# Patient Record
Sex: Female | Born: 1955
Health system: Southern US, Community
[De-identification: ages and names within clinical notes are randomized; demographics above are authoritative.]

## PROBLEM LIST (undated history)

## (undated) DIAGNOSIS — T4145XA Adverse effect of unspecified anesthetic, initial encounter: Secondary | ICD-10-CM

## (undated) DIAGNOSIS — E039 Hypothyroidism, unspecified: Secondary | ICD-10-CM

## (undated) DIAGNOSIS — T8859XA Other complications of anesthesia, initial encounter: Secondary | ICD-10-CM

## (undated) DIAGNOSIS — I10 Essential (primary) hypertension: Secondary | ICD-10-CM

## (undated) HISTORY — PX: TUBAL LIGATION: SHX77

---

## 1999-10-09 ENCOUNTER — Encounter: Payer: Self-pay | Admitting: Gynecology

## 1999-10-09 ENCOUNTER — Ambulatory Visit (HOSPITAL_COMMUNITY): Admission: RE | Admit: 1999-10-09 | Discharge: 1999-10-09 | Payer: Self-pay | Admitting: Gynecology

## 2001-01-26 ENCOUNTER — Other Ambulatory Visit: Admission: RE | Admit: 2001-01-26 | Discharge: 2001-01-26 | Payer: Self-pay | Admitting: Gynecology

## 2001-03-23 ENCOUNTER — Encounter: Payer: Self-pay | Admitting: Gynecology

## 2001-03-23 ENCOUNTER — Encounter: Admission: RE | Admit: 2001-03-23 | Discharge: 2001-03-23 | Payer: Self-pay | Admitting: Gynecology

## 2002-02-16 ENCOUNTER — Other Ambulatory Visit: Admission: RE | Admit: 2002-02-16 | Discharge: 2002-02-16 | Payer: Self-pay | Admitting: Gynecology

## 2003-05-03 ENCOUNTER — Other Ambulatory Visit: Admission: RE | Admit: 2003-05-03 | Discharge: 2003-05-03 | Payer: Self-pay | Admitting: Gynecology

## 2004-05-09 ENCOUNTER — Other Ambulatory Visit: Admission: RE | Admit: 2004-05-09 | Discharge: 2004-05-09 | Payer: Self-pay | Admitting: Gynecology

## 2004-05-24 ENCOUNTER — Encounter: Admission: RE | Admit: 2004-05-24 | Discharge: 2004-05-24 | Payer: Self-pay | Admitting: Gynecology

## 2005-05-14 ENCOUNTER — Other Ambulatory Visit: Admission: RE | Admit: 2005-05-14 | Discharge: 2005-05-14 | Payer: Self-pay | Admitting: Gynecology

## 2005-12-20 ENCOUNTER — Encounter: Admission: RE | Admit: 2005-12-20 | Discharge: 2005-12-20 | Payer: Self-pay | Admitting: Gynecology

## 2006-05-22 ENCOUNTER — Other Ambulatory Visit: Admission: RE | Admit: 2006-05-22 | Discharge: 2006-05-22 | Payer: Self-pay | Admitting: Gynecology

## 2007-01-01 ENCOUNTER — Ambulatory Visit (HOSPITAL_COMMUNITY): Admission: RE | Admit: 2007-01-01 | Discharge: 2007-01-01 | Payer: Self-pay | Admitting: Gynecology

## 2007-05-25 ENCOUNTER — Other Ambulatory Visit: Admission: RE | Admit: 2007-05-25 | Discharge: 2007-05-25 | Payer: Self-pay | Admitting: Gynecology

## 2008-03-15 ENCOUNTER — Ambulatory Visit (HOSPITAL_COMMUNITY): Admission: RE | Admit: 2008-03-15 | Discharge: 2008-03-15 | Payer: Self-pay | Admitting: Gynecology

## 2010-02-21 ENCOUNTER — Ambulatory Visit (HOSPITAL_COMMUNITY): Admission: RE | Admit: 2010-02-21 | Discharge: 2010-02-21 | Payer: Self-pay | Admitting: Gynecology

## 2011-11-04 ENCOUNTER — Encounter: Payer: Self-pay | Admitting: Family Medicine

## 2011-11-04 ENCOUNTER — Ambulatory Visit (INDEPENDENT_AMBULATORY_CARE_PROVIDER_SITE_OTHER): Payer: 59 | Admitting: Family Medicine

## 2011-11-04 DIAGNOSIS — I10 Essential (primary) hypertension: Secondary | ICD-10-CM | POA: Insufficient documentation

## 2011-11-04 NOTE — Progress Notes (Signed)
  Subjective:    Patient ID: Sara Ward, female    DOB: 1956/06/24, 55 y.o.   MRN: [id number]  HPI Sara Ward is a new patient who presents without any current complaints. She has been recieving primary care from Sara Ward, an OB-GYN for several years. However, she is ready to have a primary care physician manager her blood pressure, hypothyroidism, and health maintenance issues. A specific concern of hers is her risk of atherosclerotic disease. Her older sister has heart disease and her younger brother had a heart attack at 33 years old. She has been told that her cholesterol is "normal" in the past.    Review of Systems Patient denies headache, chest pain, shortness of breath, claudication, dizziness, weakness    Objective:   Physical Exam  Constitutional: Vital signs are normal. She appears well-developed and well-nourished. No distress.  Neck: Carotid bruit is not present. No mass and no thyromegaly present.  Cardiovascular: Normal rate, regular rhythm and normal heart sounds.   Pulses:      Dorsalis pedis pulses are 2+ on the right side, and 2+ on the left side.  Pulmonary/Chest: Effort normal and breath sounds normal.  Abdominal: She exhibits no abdominal bruit.    BP 125/82  Pulse 73  Ht 5' (1.524 m)  Wt 169 lb (76.658 kg)  BMI 33.01 kg/m2     Assessment & Plan:  Sara Ward is a very pleasant, well appearing 55 year old female presenting for care.  1. Hypertension - This was diagnosed by another physician. I have no reason to change her medication at this point. Therefore, continue Benicar HCT. Consider changing in the future for medical and financial reasons.   2. Hypothyroidism - Sara Ward is currently taking 50 mcg of levothyroxine. However, she is not certain of the indication. Therefore, I will leave her on the medication and test her TSH.  3. Lab and Follow - Up: Sara Ward will return in 3 weeks for lab draws for fasting lipid panel, CMP, CBC, and TSH. Also, I will  try to obtain records from Sara Ward.   4. Health Maintenance - The patient will agree to a colonoscopy in 2013, after the Christmas holiday.

## 2011-11-04 NOTE — Patient Instructions (Signed)
Dear Mrs. Bouchie,   Thank you for coming to clinic today. It was a pleasure to meet you. Please return in 3 weeks for a lab visit and then one week later to discuss the results.   Take Care,   Dr. Clinton Sawyer

## 2011-11-25 ENCOUNTER — Other Ambulatory Visit: Payer: 59

## 2011-11-25 DIAGNOSIS — I10 Essential (primary) hypertension: Secondary | ICD-10-CM

## 2011-11-25 LAB — COMPREHENSIVE METABOLIC PANEL
ALT: 58 U/L — ABNORMAL HIGH (ref 0–35)
Albumin: 4.7 g/dL (ref 3.5–5.2)
Alkaline Phosphatase: 136 U/L — ABNORMAL HIGH (ref 39–117)
CO2: 29 mEq/L (ref 19–32)
Glucose, Bld: 98 mg/dL (ref 70–99)
Potassium: 4 mEq/L (ref 3.5–5.3)
Sodium: 140 mEq/L (ref 135–145)
Total Protein: 7.1 g/dL (ref 6.0–8.3)

## 2011-11-25 LAB — CBC
HCT: 42.8 % (ref 36.0–46.0)
Hemoglobin: 13.7 g/dL (ref 12.0–15.0)
MCHC: 32 g/dL (ref 30.0–36.0)
RBC: 4.62 MIL/uL (ref 3.87–5.11)

## 2011-11-25 LAB — LIPID PANEL
Cholesterol: 187 mg/dL (ref 0–200)
LDL Cholesterol: 110 mg/dL — ABNORMAL HIGH (ref 0–99)
Triglycerides: 91 mg/dL (ref <150)
VLDL: 18 mg/dL (ref 0–40)

## 2011-11-25 LAB — TSH: TSH: 2.789 u[IU]/mL (ref 0.350–4.500)

## 2011-11-25 NOTE — Progress Notes (Signed)
CMP,FLP,CBC AND TSH DONE TODAY Sara Ward 

## 2011-12-04 ENCOUNTER — Ambulatory Visit (INDEPENDENT_AMBULATORY_CARE_PROVIDER_SITE_OTHER): Payer: 59 | Admitting: Family Medicine

## 2011-12-04 ENCOUNTER — Encounter: Payer: Self-pay | Admitting: Family Medicine

## 2011-12-04 DIAGNOSIS — I10 Essential (primary) hypertension: Secondary | ICD-10-CM

## 2011-12-04 DIAGNOSIS — E039 Hypothyroidism, unspecified: Secondary | ICD-10-CM

## 2011-12-04 DIAGNOSIS — M79674 Pain in right toe(s): Secondary | ICD-10-CM

## 2011-12-04 DIAGNOSIS — M79609 Pain in unspecified limb: Secondary | ICD-10-CM

## 2011-12-04 NOTE — Assessment & Plan Note (Signed)
Patient history and presentation the pain in The MCP joint of the right great toe Is most likely due to damage the joint but does not represent gout or infectious process. Given that This is chronic and not worsening, The patient has deferred any imaging or intervention at this time. I am in agreement with this plan we will reassess the injury at her followup visit.

## 2011-12-04 NOTE — Assessment & Plan Note (Signed)
Her blood pressure is well-controlled on Benicar. The drug is showing no negative side effects and is not cost prohibitive at this time. Therefore we will continue Benicar for hypertension. Recheck labs in 6 months.

## 2011-12-04 NOTE — Assessment & Plan Note (Signed)
Recent TSH Was within normal limits therefore continue Synthroid at 50 mcg daily.

## 2011-12-04 NOTE — Progress Notes (Signed)
  Subjective:    Patient ID: Sara Ward, female    DOB: 04-10-1956, 55 y.o.   MRN: [id number]  HPI Sara Ward is a 55 year old female presenting to clinic today for follow-up for lab work As well as toe pain.  1. Toe pain: The patient reports persistent pain in her right great toe. It initially started approximately one year ago she stubbed her toe. She did not have any images of her foot taken at the time of her injury. Since that time it has hurt consistently in the base of her toe and she feels a popping sensation. The pain is exacerbated by wearing shoes with heels the pain is alleviated by resting her feet. She does not feel it is getting acutely worse. She denies it is swollen, discolored or disfigured. Sara Ward has her previous doctor was concerned that it was gout. However he never treated her for gout.  2. Lab work: Prior to this visit Sara Ward had screening lab work performed that is listed below.  Review of Systems Negative for chest pain, short of breath, fever, chills, and weight loss.     Objective:   Physical Exam Gen.: Alert, oriented, no distress, pleasant and talkative Feet: Right foot with mild tenderness to palpation at the first MCP joint, no edema or erythema present, no bony deformities. Flexion extension of the right great toe or equal to that of the left. Dorsalis pedis pulses intact bilaterally.  CMP     Component Value Date/Time   NA 140 11/25/2011 0849   K 4.0 11/25/2011 0849   CL 101 11/25/2011 0849   CO2 29 11/25/2011 0849   GLUCOSE 98 11/25/2011 0849   BUN 19 11/25/2011 0849   CREATININE 0.85 11/25/2011 0849   CALCIUM 9.8 11/25/2011 0849   PROT 7.1 11/25/2011 0849   ALBUMIN 4.7 11/25/2011 0849   AST 35 11/25/2011 0849   ALT 58* 11/25/2011 0849   ALKPHOS 136* 11/25/2011 0849   BILITOT 0.4 11/25/2011 0849   Lab Results  Component Value Date   WBC 6.6 11/25/2011   HGB 13.7 11/25/2011   HCT 42.8 11/25/2011   MCV 92.6 11/25/2011   PLT 308 11/25/2011     Lab  Results  Component Value Date   TSH 2.789 11/25/2011    Lipid Panel     Component Value Date/Time   CHOL 187 11/25/2011 0849   TRIG 91 11/25/2011 0849   HDL 59 11/25/2011 0849   CHOLHDL 3.2 11/25/2011 0849   VLDL 18 11/25/2011 0849   LDLCALC 110* 11/25/2011 0849          Assessment & Plan:  Sara Ward is a healthy 55 year old female who is here for presentation of right toe pain and to review her labs. Based upon her risk factors she does not need statin treatment for her LDL.

## 2011-12-04 NOTE — Patient Instructions (Signed)
Dear Sara Ward, Thank you for coming to see me in clinic today. Please read below regarding specific issues that we addressed. 1. Cholesterol- your cholesterol is within the appropriate range given your risk factors. There is no need to start cholesterol medication at this point. 2. Colon cancer screening- After the age of 88 as appropriate probably to be screened for colon cancer. Please call the number given to arrange for colonoscopy.  Please follow up in 6 months for lab work and blood pressure check. However if you need anything earlier please call.  Sincerely, Dr. Clinton Sawyer

## 2012-07-30 ENCOUNTER — Encounter: Payer: Self-pay | Admitting: Internal Medicine

## 2012-08-28 ENCOUNTER — Encounter: Payer: 59 | Admitting: Internal Medicine

## 2012-09-10 ENCOUNTER — Other Ambulatory Visit: Payer: Self-pay | Admitting: Gynecology

## 2012-09-10 DIAGNOSIS — Z1231 Encounter for screening mammogram for malignant neoplasm of breast: Secondary | ICD-10-CM

## 2012-09-16 ENCOUNTER — Ambulatory Visit
Admission: RE | Admit: 2012-09-16 | Discharge: 2012-09-16 | Disposition: A | Discharge status: Home / Self Care | Payer: 59 | Source: Ambulatory Visit | Attending: Gynecology | Admitting: Gynecology

## 2012-09-16 DIAGNOSIS — Z1231 Encounter for screening mammogram for malignant neoplasm of breast: Secondary | ICD-10-CM

## 2012-12-23 HISTORY — PX: APPENDECTOMY: SHX54

## 2013-08-13 LAB — HEMOGLOBIN A1C: Hgb A1c MFr Bld: 6.2 % — AB (ref 4.0–6.0)

## 2013-08-13 LAB — TSH: TSH: 2.69 u[IU]/mL (ref <=5.90)

## 2013-08-19 ENCOUNTER — Encounter: Payer: Self-pay | Admitting: Family Medicine

## 2013-11-30 ENCOUNTER — Ambulatory Visit: Admit: 2013-11-30 | Payer: Self-pay | Admitting: General Surgery

## 2013-11-30 ENCOUNTER — Observation Stay (HOSPITAL_COMMUNITY): Payer: 59 | Admitting: Anesthesiology

## 2013-11-30 ENCOUNTER — Encounter (HOSPITAL_COMMUNITY): Admission: EM | Disposition: A | Discharge status: Home / Self Care | Payer: Self-pay | Source: Home / Self Care

## 2013-11-30 ENCOUNTER — Inpatient Hospital Stay (HOSPITAL_COMMUNITY)
Admission: EM | Admit: 2013-11-30 | Discharge: 2013-12-04 | DRG: 340 | Disposition: A | Discharge status: Home / Self Care | Payer: 59 | Attending: Surgery | Admitting: Surgery

## 2013-11-30 ENCOUNTER — Encounter (HOSPITAL_COMMUNITY): Payer: 59 | Admitting: Anesthesiology

## 2013-11-30 ENCOUNTER — Encounter (HOSPITAL_COMMUNITY): Payer: Self-pay | Admitting: Emergency Medicine

## 2013-11-30 DIAGNOSIS — I10 Essential (primary) hypertension: Secondary | ICD-10-CM | POA: Diagnosis present

## 2013-11-30 DIAGNOSIS — K3532 Acute appendicitis with perforation and localized peritonitis, without abscess: Secondary | ICD-10-CM

## 2013-11-30 DIAGNOSIS — K35209 Acute appendicitis with generalized peritonitis, without abscess, unspecified as to perforation: Principal | ICD-10-CM | POA: Diagnosis present

## 2013-11-30 DIAGNOSIS — Z79899 Other long term (current) drug therapy: Secondary | ICD-10-CM

## 2013-11-30 DIAGNOSIS — K3580 Unspecified acute appendicitis: Secondary | ICD-10-CM

## 2013-11-30 DIAGNOSIS — E039 Hypothyroidism, unspecified: Secondary | ICD-10-CM | POA: Diagnosis present

## 2013-11-30 DIAGNOSIS — K352 Acute appendicitis with generalized peritonitis, without abscess: Principal | ICD-10-CM | POA: Diagnosis present

## 2013-11-30 DIAGNOSIS — K3533 Acute appendicitis with perforation and localized peritonitis, with abscess: Secondary | ICD-10-CM | POA: Diagnosis present

## 2013-11-30 DIAGNOSIS — R000 Tachycardia, unspecified: Secondary | ICD-10-CM | POA: Diagnosis not present

## 2013-11-30 HISTORY — DX: Essential (primary) hypertension: I10

## 2013-11-30 HISTORY — DX: Adverse effect of unspecified anesthetic, initial encounter: T41.45XA

## 2013-11-30 HISTORY — PX: LAPAROSCOPIC APPENDECTOMY: SHX408

## 2013-11-30 HISTORY — DX: Other complications of anesthesia, initial encounter: T88.59XA

## 2013-11-30 HISTORY — DX: Hypothyroidism, unspecified: E03.9

## 2013-11-30 LAB — CBC WITH DIFFERENTIAL/PLATELET
Basophils Absolute: 0 10*3/uL (ref 0.0–0.1)
Basophils Relative: 0 % (ref 0–1)
Eosinophils Absolute: 0 10*3/uL (ref 0.0–0.7)
Eosinophils Relative: 0 % (ref 0–5)
HCT: 38.9 % (ref 36.0–46.0)
Hemoglobin: 13 g/dL (ref 12.0–15.0)
Lymphocytes Relative: 9 % — ABNORMAL LOW (ref 12–46)
Lymphs Abs: 1.4 10*3/uL (ref 0.7–4.0)
MCH: 29.5 pg (ref 26.0–34.0)
MCHC: 33.4 g/dL (ref 30.0–36.0)
MCV: 88.4 fL (ref 78.0–100.0)
Monocytes Absolute: 0.9 10*3/uL (ref 0.1–1.0)
Monocytes Relative: 6 % (ref 3–12)
Neutro Abs: 12.7 10*3/uL — ABNORMAL HIGH (ref 1.7–7.7)
Neutrophils Relative %: 85 % — ABNORMAL HIGH (ref 43–77)
Platelets: 245 10*3/uL (ref 150–400)
RBC: 4.4 MIL/uL (ref 3.87–5.11)
RDW: 12.7 % (ref 11.5–15.5)
WBC: 15 10*3/uL — ABNORMAL HIGH (ref 4.0–10.5)

## 2013-11-30 LAB — BASIC METABOLIC PANEL
BUN: 11 mg/dL (ref 6–23)
CO2: 25 mEq/L (ref 19–32)
Calcium: 9.6 mg/dL (ref 8.4–10.5)
Chloride: 91 mEq/L — ABNORMAL LOW (ref 96–112)
Creatinine, Ser: 0.69 mg/dL (ref 0.50–1.10)
GFR calc Af Amer: >90 mL/min (ref >90)
GFR calc non Af Amer: >90 mL/min (ref >90)
Glucose, Bld: 130 mg/dL — ABNORMAL HIGH (ref 70–99)
Potassium: 2.9 mEq/L — ABNORMAL LOW (ref 3.5–5.1)
Sodium: 130 mEq/L — ABNORMAL LOW (ref 135–145)

## 2013-11-30 LAB — URINE MICROSCOPIC-ADD ON

## 2013-11-30 LAB — URINALYSIS, ROUTINE W REFLEX MICROSCOPIC
Bilirubin Urine: NEGATIVE
Glucose, UA: NEGATIVE mg/dL
Ketones, ur: NEGATIVE mg/dL
Leukocytes, UA: NEGATIVE
Nitrite: NEGATIVE
Protein, ur: NEGATIVE mg/dL
Specific Gravity, Urine: 1.037 — ABNORMAL HIGH (ref 1.005–1.030)
Urobilinogen, UA: 0.2 mg/dL (ref 0.0–1.0)
pH: 7 (ref 5.0–8.0)

## 2013-11-30 SURGERY — APPENDECTOMY, LAPAROSCOPIC
Anesthesia: General | Site: Abdomen

## 2013-11-30 MED ORDER — SODIUM CHLORIDE 0.9 % IJ SOLN
INTRAMUSCULAR | Status: AC
  Filled 2013-11-30: qty 10

## 2013-11-30 MED ORDER — MORPHINE SULFATE 4 MG/ML IJ SOLN
6.0000 mg | Freq: Once | INTRAMUSCULAR | Status: AC
  Administered 2013-11-30: 6 mg via INTRAVENOUS
  Filled 2013-11-30: qty 2

## 2013-11-30 MED ORDER — DIPHENHYDRAMINE HCL 12.5 MG/5ML PO ELIX: 12.5000 mg | ORAL_SOLUTION | Freq: Four times a day (QID) | ORAL | Status: DC | PRN

## 2013-11-30 MED ORDER — MEPERIDINE HCL 50 MG/ML IJ SOLN: 6.2500 mg | INTRAMUSCULAR | Status: DC | PRN

## 2013-11-30 MED ORDER — PIPERACILLIN-TAZOBACTAM 3.375 G IVPB
3.3750 g | Freq: Three times a day (TID) | INTRAVENOUS | Status: DC
  Administered 2013-12-01 – 2013-12-04 (×10): 3.375 g via INTRAVENOUS
  Filled 2013-11-30 (×13): qty 50

## 2013-11-30 MED ORDER — ROCURONIUM BROMIDE 100 MG/10ML IV SOLN
INTRAVENOUS | Status: DC | PRN
  Administered 2013-11-30: 30 mg via INTRAVENOUS
  Administered 2013-11-30: 5 mg via INTRAVENOUS

## 2013-11-30 MED ORDER — PROPOFOL 10 MG/ML IV BOLUS
INTRAVENOUS | Status: AC
  Filled 2013-11-30: qty 20

## 2013-11-30 MED ORDER — SUCCINYLCHOLINE CHLORIDE 20 MG/ML IJ SOLN
INTRAMUSCULAR | Status: AC
  Filled 2013-11-30: qty 1

## 2013-11-30 MED ORDER — HEPARIN SODIUM (PORCINE) 5000 UNIT/ML IJ SOLN
5000.0000 [IU] | Freq: Three times a day (TID) | INTRAMUSCULAR | Status: DC
  Administered 2013-12-01 – 2013-12-04 (×10): 5000 [IU] via SUBCUTANEOUS
  Filled 2013-11-30 (×13): qty 1

## 2013-11-30 MED ORDER — PROPOFOL 10 MG/ML IV BOLUS
INTRAVENOUS | Status: DC | PRN
  Administered 2013-11-30: 160 mg via INTRAVENOUS

## 2013-11-30 MED ORDER — PIPERACILLIN-TAZOBACTAM 3.375 G IVPB 30 MIN
3.3750 g | INTRAVENOUS | Status: AC
  Administered 2013-11-30: 3.375 g via INTRAVENOUS
  Filled 2013-11-30 (×2): qty 50

## 2013-11-30 MED ORDER — OXYCODONE HCL 5 MG PO TABS: 5.0000 mg | ORAL_TABLET | Freq: Once | ORAL | Status: DC | PRN

## 2013-11-30 MED ORDER — DIPHENHYDRAMINE HCL 50 MG/ML IJ SOLN: 12.5000 mg | Freq: Four times a day (QID) | INTRAMUSCULAR | Status: DC | PRN

## 2013-11-30 MED ORDER — MIDAZOLAM HCL 5 MG/5ML IJ SOLN
INTRAMUSCULAR | Status: DC | PRN
  Administered 2013-11-30: 1 mg via INTRAVENOUS

## 2013-11-30 MED ORDER — PROMETHAZINE HCL 25 MG/ML IJ SOLN: 6.2500 mg | INTRAMUSCULAR | Status: DC | PRN

## 2013-11-30 MED ORDER — LIDOCAINE HCL (CARDIAC) 20 MG/ML IV SOLN
INTRAVENOUS | Status: DC | PRN
  Administered 2013-11-30: 80 mg via INTRAVENOUS

## 2013-11-30 MED ORDER — FENTANYL CITRATE 0.05 MG/ML IJ SOLN
INTRAMUSCULAR | Status: DC | PRN
  Administered 2013-11-30 (×5): 50 ug via INTRAVENOUS

## 2013-11-30 MED ORDER — FENTANYL CITRATE 0.05 MG/ML IJ SOLN
INTRAMUSCULAR | Status: AC
  Filled 2013-11-30: qty 5

## 2013-11-30 MED ORDER — EPHEDRINE SULFATE 50 MG/ML IJ SOLN
INTRAMUSCULAR | Status: AC
  Filled 2013-11-30: qty 1

## 2013-11-30 MED ORDER — NEOSTIGMINE METHYLSULFATE 1 MG/ML IJ SOLN
INTRAMUSCULAR | Status: DC | PRN
  Administered 2013-11-30: 4 mg via INTRAVENOUS

## 2013-11-30 MED ORDER — PIPERACILLIN-TAZOBACTAM 3.375 G IVPB: 3.3750 g | Freq: Once | INTRAVENOUS | Status: DC

## 2013-11-30 MED ORDER — EPHEDRINE SULFATE 50 MG/ML IJ SOLN
INTRAMUSCULAR | Status: DC | PRN
  Administered 2013-11-30: 5 mg via INTRAVENOUS

## 2013-11-30 MED ORDER — MIDAZOLAM HCL 2 MG/2ML IJ SOLN
INTRAMUSCULAR | Status: AC
  Filled 2013-11-30: qty 2

## 2013-11-30 MED ORDER — LACTATED RINGERS IV SOLN
INTRAVENOUS | Status: DC | PRN
  Administered 2013-11-30: 19:00:00 via INTRAVENOUS

## 2013-11-30 MED ORDER — HYDROMORPHONE HCL PF 1 MG/ML IJ SOLN: 0.2500 mg | INTRAMUSCULAR | Status: DC | PRN

## 2013-11-30 MED ORDER — LEVOTHYROXINE SODIUM 50 MCG PO TABS
50.0000 ug | ORAL_TABLET | Freq: Every day | ORAL | Status: DC
  Administered 2013-12-01 – 2013-12-03 (×3): 50 ug via ORAL
  Filled 2013-11-30 (×6): qty 1

## 2013-11-30 MED ORDER — SUCCINYLCHOLINE CHLORIDE 20 MG/ML IJ SOLN
INTRAMUSCULAR | Status: DC | PRN
  Administered 2013-11-30: 100 mg via INTRAVENOUS

## 2013-11-30 MED ORDER — HYDROMORPHONE HCL PF 1 MG/ML IJ SOLN
0.5000 mg | INTRAMUSCULAR | Status: DC | PRN
  Administered 2013-11-30 – 2013-12-02 (×12): 1 mg via INTRAVENOUS
  Filled 2013-11-30 (×12): qty 1

## 2013-11-30 MED ORDER — BUPIVACAINE HCL (PF) 0.5 % IJ SOLN
INTRAMUSCULAR | Status: AC
  Filled 2013-11-30: qty 30

## 2013-11-30 MED ORDER — GLYCOPYRROLATE 0.2 MG/ML IJ SOLN
INTRAMUSCULAR | Status: DC | PRN
  Administered 2013-11-30: .6 mg via INTRAVENOUS

## 2013-11-30 MED ORDER — POTASSIUM CHLORIDE IN NACL 20-0.9 MEQ/L-% IV SOLN
INTRAVENOUS | Status: DC
  Administered 2013-11-30: 16:00:00 via INTRAVENOUS
  Filled 2013-11-30 (×3): qty 1000

## 2013-11-30 MED ORDER — GLYCOPYRROLATE 0.2 MG/ML IJ SOLN
INTRAMUSCULAR | Status: AC
  Filled 2013-11-30: qty 3

## 2013-11-30 MED ORDER — ONDANSETRON HCL 4 MG/2ML IJ SOLN: 4.0000 mg | Freq: Four times a day (QID) | INTRAMUSCULAR | Status: DC | PRN

## 2013-11-30 MED ORDER — OXYCODONE HCL 5 MG/5ML PO SOLN
5.0000 mg | Freq: Once | ORAL | Status: DC | PRN
  Filled 2013-11-30: qty 5

## 2013-11-30 MED ORDER — KCL-LACTATED RINGERS-D5W 20 MEQ/L IV SOLN
INTRAVENOUS | Status: DC
  Administered 2013-11-30 – 2013-12-03 (×6): via INTRAVENOUS
  Filled 2013-11-30 (×12): qty 1000

## 2013-11-30 MED ORDER — ONDANSETRON HCL 4 MG/2ML IJ SOLN
INTRAMUSCULAR | Status: DC | PRN
  Administered 2013-11-30: 4 mg via INTRAVENOUS

## 2013-11-30 MED ORDER — LIDOCAINE HCL (CARDIAC) 20 MG/ML IV SOLN
INTRAVENOUS | Status: AC
  Filled 2013-11-30: qty 5

## 2013-11-30 MED ORDER — BUPIVACAINE-EPINEPHRINE 0.5% -1:200000 IJ SOLN
INTRAMUSCULAR | Status: DC | PRN
  Administered 2013-11-30: 10 mL

## 2013-11-30 MED ORDER — ONDANSETRON HCL 4 MG/2ML IJ SOLN: 4.0000 mg | INTRAMUSCULAR | Status: DC | PRN

## 2013-11-30 MED ORDER — ONDANSETRON HCL 4 MG/2ML IJ SOLN
INTRAMUSCULAR | Status: AC
  Filled 2013-11-30: qty 2

## 2013-11-30 SURGICAL SUPPLY — 45 items
APPLIER CLIP 5 13 M/L LIGAMAX5 (MISCELLANEOUS)
APPLIER CLIP ROT 10 11.4 M/L (STAPLE) ×2
BENZOIN TINCTURE PRP APPL 2/3 (GAUZE/BANDAGES/DRESSINGS) ×2 IMPLANT
CANISTER SUCTION 2500CC (MISCELLANEOUS) ×2 IMPLANT
CHLORAPREP W/TINT 26ML (MISCELLANEOUS) ×2 IMPLANT
CLIP APPLIE 5 13 M/L LIGAMAX5 (MISCELLANEOUS) IMPLANT
CLIP APPLIE ROT 10 11.4 M/L (STAPLE) ×1 IMPLANT
CLOSURE STERI-STRIP 1/4X4 (GAUZE/BANDAGES/DRESSINGS) ×2 IMPLANT
CUTTER FLEX LINEAR 45M (STAPLE) ×2 IMPLANT
DECANTER SPIKE VIAL GLASS SM (MISCELLANEOUS) ×2 IMPLANT
DERMABOND ADVANCED (GAUZE/BANDAGES/DRESSINGS)
DERMABOND ADVANCED .7 DNX12 (GAUZE/BANDAGES/DRESSINGS) IMPLANT
DISSECTOR BLUNT TIP ENDO 5MM (MISCELLANEOUS) ×2 IMPLANT
DRAPE LAPAROSCOPIC ABDOMINAL (DRAPES) ×2 IMPLANT
DRSG TEGADERM 2-3/8X2-3/4 SM (GAUZE/BANDAGES/DRESSINGS) ×2 IMPLANT
ELECT REM PT RETURN 9FT ADLT (ELECTROSURGICAL) ×2
ELECTRODE REM PT RTRN 9FT ADLT (ELECTROSURGICAL) ×1 IMPLANT
GAUZE SPONGE 2X2 8PLY STRL LF (GAUZE/BANDAGES/DRESSINGS) ×1 IMPLANT
GLOVE BIOGEL PI IND STRL 7.0 (GLOVE) ×1 IMPLANT
GLOVE BIOGEL PI INDICATOR 7.0 (GLOVE) ×1
GLOVE SS BIOGEL STRL SZ 7.5 (GLOVE) ×1 IMPLANT
GLOVE SUPERSENSE BIOGEL SZ 7.5 (GLOVE) ×1
GOWN PREVENTION PLUS LG XLONG (DISPOSABLE) ×2 IMPLANT
GOWN STRL REIN XL XLG (GOWN DISPOSABLE) ×4 IMPLANT
IV LACTATED RINGERS 1000ML (IV SOLUTION) ×2 IMPLANT
KIT BASIN OR (CUSTOM PROCEDURE TRAY) ×2 IMPLANT
NS IRRIG 1000ML POUR BTL (IV SOLUTION) ×2 IMPLANT
PENCIL BUTTON HOLSTER BLD 10FT (ELECTRODE) ×2 IMPLANT
POUCH SPECIMEN RETRIEVAL 10MM (ENDOMECHANICALS) ×2 IMPLANT
RELOAD 45 VASCULAR/THIN (ENDOMECHANICALS) IMPLANT
RELOAD STAPLE TA45 3.5 REG BLU (ENDOMECHANICALS) ×2 IMPLANT
SCALPEL HARMONIC ACE (MISCELLANEOUS) ×2 IMPLANT
SET IRRIG TUBING LAPAROSCOPIC (IRRIGATION / IRRIGATOR) ×2 IMPLANT
SOLUTION ANTI FOG 6CC (MISCELLANEOUS) ×2 IMPLANT
SPONGE GAUZE 2X2 STER 10/PKG (GAUZE/BANDAGES/DRESSINGS) ×1
STRIP CLOSURE SKIN 1/2X4 (GAUZE/BANDAGES/DRESSINGS) ×2 IMPLANT
SUT MNCRL AB 4-0 PS2 18 (SUTURE) ×2 IMPLANT
TOWEL OR 17X26 10 PK STRL BLUE (TOWEL DISPOSABLE) ×2 IMPLANT
TRAY FOLEY CATH 14FRSI W/METER (CATHETERS) ×2 IMPLANT
TRAY LAP CHOLE (CUSTOM PROCEDURE TRAY) ×2 IMPLANT
TROCAR BLADELESS OPT 5 75 (ENDOMECHANICALS) ×2 IMPLANT
TROCAR XCEL 12X100 BLDLESS (ENDOMECHANICALS) ×2 IMPLANT
TROCAR XCEL BLUNT TIP 100MML (ENDOMECHANICALS) ×2 IMPLANT
TROCAR Z-THREAD FIOS 12X100MM (TROCAR) ×2 IMPLANT
TUBING INSUFFLATION 10FT LAP (TUBING) ×2 IMPLANT

## 2013-11-30 NOTE — ED Provider Notes (Signed)
CSN: [id number]     Arrival date & time 11/30/13  1258 History   First MD Initiated Contact with Patient 11/30/13 1259     Chief Complaint  Patient presents with  . Abdominal Pain    HPI Patient reports worsening right lower quadrant abdominal pain over the past 24 hours.  Speaking with her family they seem to think that her abdominal pain began more Saturday evening and Sunday morning.  Chills without documented fever.  Patient was seen at the outside urgent care and an outside CT scan of her abdomen and pelvis was obtained which demonstrates acute appendicitis.  General surgery is aware of this patient.  Pain is moderate in severity at this time.  Pain is worse by movement and palpation of the right side of her abdomen   Past Medical History  Diagnosis Date  . Hypothyroidism   . Hypertension   . Complication of anesthesia     hard to wake up   Past Surgical History  Procedure Laterality Date  . Tubal ligation     Family History  Problem Relation Age of Onset  . Hypertension Mother   . Hypertension Father   . Emphysema Father    History  Substance Use Topics  . Smoking status: Never Smoker   . Smokeless tobacco: Never Used  . Alcohol Use: No   OB History   Grav Para Term Preterm Abortions TAB SAB Ect Mult Living                 Review of Systems  All other systems reviewed and are negative.    Allergies  Review of patient's allergies indicates no known allergies.  Home Medications   Current Outpatient Rx  Name  Route  Sig  Dispense  Refill  . levothyroxine (SYNTHROID, LEVOTHROID) 50 MCG tablet   Oral   Take 50 mcg by mouth daily.           Marland Kitchen VAGIFEM 10 MCG TABS vaginal tablet   Oral   Take 1 application by mouth 2 (two) times a week.         . valsartan-hydrochlorothiazide (DIOVAN-HCT) 160-25 MG per tablet   Oral   Take 1 tablet by mouth daily.          BP 149/77  Pulse 95  Temp(Src) 98.4 F (36.9 C) (Oral)  Resp 20  SpO2 95% Physical Exam   Nursing note and vitals reviewed. Constitutional: She is oriented to person, place, and time. She appears well-developed and well-nourished.  HENT:  Head: Normocephalic.  Eyes: EOM are normal.  Neck: Normal range of motion.  Pulmonary/Chest: Effort normal.  Abdominal: She exhibits no distension.  RLQ abd tenderness  Musculoskeletal: Normal range of motion.  Neurological: She is alert and oriented to person, place, and time.  Psychiatric: She has a normal mood and affect.    ED Course  Procedures (including critical care time) Labs Review Labs Reviewed  CBC WITH DIFFERENTIAL - Abnormal; Notable for the following:    WBC 15.0 (*)    Neutrophils Relative % 85 (*)    Neutro Abs 12.7 (*)    Lymphocytes Relative 9 (*)    All other components within normal limits  BASIC METABOLIC PANEL - Abnormal; Notable for the following:    Sodium 130 (*)    Potassium 2.9 (*)    Chloride 91 (*)    Glucose, Bld 130 (*)    All other components within normal limits  URINALYSIS, ROUTINE W REFLEX  MICROSCOPIC - Abnormal; Notable for the following:    Specific Gravity, Urine 1.037 (*)    Hgb urine dipstick TRACE (*)    All other components within normal limits  URINE MICROSCOPIC-ADD ON - Abnormal; Notable for the following:    Squamous Epithelial / LPF FEW (*)    All other components within normal limits   Imaging Review No results found.  EKG Interpretation   None       MDM   1. Ruptured appendicitis    Appendicitis with possible rupture.  IV Zosyn now.  General surgery consultation.  N.p.o.  Likely surgery.    Lyanne Co, MD 11/30/13 [phone]

## 2013-11-30 NOTE — ED Notes (Signed)
Bed: WA01 Expected date:  Expected time:  Means of arrival:  Comments: [place], appy

## 2013-11-30 NOTE — Anesthesia Preprocedure Evaluation (Addendum)
Anesthesia Evaluation  Patient identified by MRN, date of birth, ID band Patient awake    Reviewed: Allergy & Precautions, H&P , NPO status , Patient's Chart, lab work & pertinent test results  History of Anesthesia Complications Negative for: history of anesthetic complications  Airway Mallampati: II TM Distance: >3 FB Neck ROM: Full    Dental  (+) Dental Advisory Given and Teeth Intact   Pulmonary neg pulmonary ROS,  breath sounds clear to auscultation        Cardiovascular hypertension, Pt. on medications Rhythm:Regular Rate:Normal     Neuro/Psych negative neurological ROS  negative psych ROS   GI/Hepatic negative GI ROS, Neg liver ROS,   Endo/Other  Hypothyroidism   Renal/GU negative Renal ROS     Musculoskeletal negative musculoskeletal ROS (+)   Abdominal   Peds  Hematology negative hematology ROS (+)   Anesthesia Other Findings   Reproductive/Obstetrics negative OB ROS                         Anesthesia Physical Anesthesia Plan  ASA: II  Anesthesia Plan: General   Post-op Pain Management:    Induction: Intravenous, Rapid sequence and Cricoid pressure planned  Airway Management Planned: Oral ETT  Additional Equipment:   Intra-op Plan:   Post-operative Plan: Extubation in OR  Informed Consent: I have reviewed the patients History and Physical, chart, labs and discussed the procedure including the risks, benefits and alternatives for the proposed anesthesia with the patient or authorized representative who has indicated his/her understanding and acceptance.   Dental advisory given  Plan Discussed with: CRNA  Anesthesia Plan Comments:         Anesthesia Quick Evaluation

## 2013-11-30 NOTE — ED Notes (Signed)
On sun pt began not feeling well and started having abd pain. Went to [facility] urgent [facility] and had ct scan done today with blood work. Pt was sent over for perferated appy. Ct scan brought with pt. Pt does have a rash to lt side of neck where pt states it is from the iv dye contrast no sob, with it.

## 2013-11-30 NOTE — ED Notes (Signed)
Paged [facility] surgical pa.

## 2013-11-30 NOTE — Transfer of Care (Signed)
Immediate Anesthesia Transfer of Care Note  Patient: Sara Ward  Procedure(s) Performed: Procedure(s): APPENDECTOMY LAPAROSCOPIC (N/A)  Patient Location: PACU  Anesthesia Type:General  Level of Consciousness: awake, alert , oriented and patient cooperative  Airway & Oxygen Therapy: Patient Spontanous Breathing and Patient connected to face mask oxygen  Post-op Assessment: Report given to PACU RN, Post -op Vital signs reviewed and stable and Patient moving all extremities  Post vital signs: Reviewed and stable  Complications: No apparent anesthesia complications

## 2013-11-30 NOTE — Preoperative (Signed)
Beta Blockers   Reason not to administer Beta Blockers:Not Applicable 

## 2013-11-30 NOTE — Anesthesia Postprocedure Evaluation (Signed)
Anesthesia Post Note  Patient: Sara Ward  Procedure(s) Performed: Procedure(s) (LRB): APPENDECTOMY LAPAROSCOPIC (N/A)  Anesthesia type: General  Patient location: PACU  Post pain: Pain level controlled  Post assessment: Post-op Vital signs reviewed  Last Vitals: BP 121/75  Pulse 90  Temp(Src) 37.6 C (Oral)  Resp 18  Ht 5\' 1"  (1.549 m)  Wt 170 lb (77.111 kg)  BMI 32.14 kg/m2  SpO2 93%  Post vital signs: Reviewed  Level of consciousness: sedated  Complications: No apparent anesthesia complications

## 2013-11-30 NOTE — H&P (Signed)
Patient interviewed and examined, agree with PA note above.  Mariella Saa MD, FACS  11/30/2013 6:51 PM

## 2013-11-30 NOTE — H&P (Signed)
Sara Ward is an 57 y.o. female.   Chief Complaint: Abdominal pain Dr. Margretta Ward at [facility] HPI: relatively healthy 57 y/o who felt bad on Sunday evening, rather vague symptoms, thought it was from eating junk food,11/28/13.  She woke up about 2 AM Monday 12/8 with diffuse abdominal pain.  It would come and go.  Pepto bismol did not help  She did not eat much, no nausea or vomiting yesterday.  She had pain on and off all day.  Today she tried to eat some cereal with ongoing pain.  This led to some nausea and vomiting.  She presented to an Urgent care and was sent for a CT scan.  This shows acute appendicitis with possible rupture.  She was sent to the ER at [facility] and we were ask to see.  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  Prior to Admission medications   Medication Sig Start Date End Date Taking? Authorizing Provider  levothyroxine (SYNTHROID, LEVOTHROID) 50 MCG tablet Take 50 mcg by mouth daily.     Yes Historical Provider, MD  VAGIFEM 10 MCG TABS vaginal tablet Take 1 application by mouth 2 (two) times a week. 11/13/13  Yes Historical Provider, MD  valsartan-hydrochlorothiazide (DIOVAN-HCT) 160-25 MG per tablet Take 1 tablet by mouth daily. 10/30/13  Yes Historical Provider, MD    Allergies: No Known Allergies    Review of Systems  Constitutional: Positive for chills. Negative for fever, weight loss, malaise/fatigue and diaphoresis.  HENT: Negative.   Eyes: Negative.   Respiratory: Negative.   Cardiovascular: Negative.   Gastrointestinal: Positive for heartburn, nausea (this AM) and abdominal pain (started early 2AM Monday).  Negative for vomiting, diarrhea, constipation, blood in stool and melena.  Genitourinary: Negative for dysuria, urgency, frequency, hematuria and flank pain.       Odor from urine since yesterday.  Musculoskeletal: Negative.   Skin: Negative.   Neurological: Negative.  Negative for weakness.  Endo/Heme/Allergies: Negative.   Psychiatric/Behavioral: Negative.     Blood pressure 149/77, pulse 95, temperature 98.4 F (36.9 C), temperature source Oral, resp. rate 20, SpO2 95.00%. Physical Exam  Constitutional: She is oriented to person, place, and time. She appears well-developed and well-nourished. No distress.  No distress, but having abdominal pain and it hurts to move.  HENT:  Head: Normocephalic and atraumatic.  Eyes: Conjunctivae and EOM are normal. Pupils are equal, round, and reactive to light. Right eye exhibits no discharge. Left eye exhibits no discharge. No scleral icterus.  Neck: Normal range of motion. Neck supple. No JVD present. No tracheal deviation present. No thyromegaly present.  Cardiovascular: Normal rate, regular rhythm, normal heart sounds and intact distal pulses.  Exam reveals no gallop.   No murmur heard. Respiratory: Effort normal. No respiratory distress. She has no wheezes. She has no rales. She exhibits no tenderness.  GI: Soft. Bowel sounds are normal. She exhibits distension (mild). She exhibits no mass. There is tenderness (she was diffusely tender to start, now it is more in the RLQ.  Pain is relieved by lying down.). There is rebound (some mild rebound, more  in lower quad than upper quad.Marland Kitchen). There is no guarding.  Musculoskeletal: She exhibits no edema and no tenderness.  Lymphadenopathy:    She has no cervical adenopathy.  Neurological: She is alert and oriented to person, place, and time. No cranial nerve deficit.  Skin: Skin is warm and dry. No rash noted. She is not diaphoretic. No erythema. No pallor.  Psychiatric: She has a normal mood and affect. Her  behavior is normal. Judgment and thought content normal.     Assessment/Plan 1. Acute appendicitis with possible rupture 2.  Hypertension 3.  Hypothyroid  Plan:  IV fluids, IV antibiotics, surgery later this afternoon. I have discussed the procedure and risks of appendectomy. The risks include but are not limited to bleeding, infection, wound problems, anesthesia, injury to intra-abdominal organs, possibility of postoperative ileus. He/she seems to understand and agrees with the plan.   Sara Ward 11/30/2013, 1:50 PM

## 2013-11-30 NOTE — ED Provider Notes (Signed)
I personally saw and evaluated this patient  Lyanne Co, MD 11/30/13 [phone]

## 2013-11-30 NOTE — Op Note (Signed)
Operative Note  Sara Ward female 57 y.o. 11/30/2013  PREOPERATIVE DX:  Acute appendicitis  POSTOPERATIVE DX:  Perforated appendicitis  PROCEDURE:  Laparoscopic appendectomy         Surgeon: Adolph Pollack   Assistants: None  Anesthesia: General endotracheal anesthesia  Indications: This is a 57 year old female whose been having abdominal pain for the past 2 days.  She began having nausea and vomiting today. She went to urgent care was evaluated. CT scan demonstrated findings consistent with acute appendicitis with possible perforation. She was admitted to the hospital started on IV antibiotics. She now brought to the operating room for the above procedure. She was seen and examined in the holding room. The procedure and risks were discussed with her. Risks include but not limited to bleeding, infection, wound healing problems, anesthesia, postoperative ileus, and injury to intra-abdominal organs.    Procedure Detail:  She was brought to the operating room placed supine on the operating table and a general anesthetic was administered. A Foley catheter was inserted. An oral gastric tube was inserted. The abdominal wall was sterilely prepped and draped. A timeout was performed.  Marcaine was infiltrated in the subcutaneous tissues in the subumbilical region. A transverse subumbilical incision was made through previous scar and carried down through the subcutaneous tissues. An incision was made an anterior posterior fashion peritoneum under direct vision. The peritoneal cavity was entered. A pursestring suture of 0 Vicryl is placed around the fascial edges. A Hassan trocar was inserted into the peritoneal cavity and a pneumoperitoneum was created by insufflation of CO2 gas.  The laparoscope was introduced and there is no evidence of bleeding or underlying organ injury. The right lower quadrant was visualized and there was some purulent fluid present. A 5 mm trocar was placed in the left  lower quadrant and one in the right upper quadrant. A began manipulating the area of inflammation. I identified the appendix and there is a small rupture in the middle of it with a small amount of feculent material outside of the appendix that was focally contained.. The appendices epiploica was adherent to the appendix as was the distal ileum. Using careful blunt dissection these both were separated from the appendix. I then identified the mesial appendix and divided it down to the base of the appendix with the harmonic scalpel. The appendix and carefully retracted anteriorly and amputated off the cecum, with a small cuff of cecum, using the Endo GIA stapler. The appendix was then placed in a retrieval bag and removed through the subumbilical incision.  I then irrigated out the abdominal cavity copiously with 4 L of solution. I inspected the staple line and it was solid without evidence of leak or bleeding. The fluid was evacuated as much as possible. The 4 quadrant inspection demonstrated no evidence of organ injury or bleeding.  The left lower quadrant trocar was then removed. The subumbilical trocar was removed and the fascia defect closed by tenting up and tying down the pursestring suture. The CO2 gas was released and the remaining right upper quadrant trocar was removed.  Skin incisions were closed with 4-0 Monocryl subcuticular stitches. Steri-Strips and sterile dressings were applied.  She tolerated the procedure without apparent complications he was taken to the recovery room in satisfactory condition.      Findings:  Perforated appendicitis with focal peritonitis.  Estimated Blood Loss:  100 ml         Drains: none  Blood Given: none  Specimens: Appendix        Complications:  * No complications entered in OR log *         Disposition: PACU - hemodynamically stable.         Condition: stable

## 2013-11-30 NOTE — ED Provider Notes (Signed)
MSE was initiated and I personally evaluated the patient and placed orders (if any) at  2:17 PM on November 30, 2013.  The patient appears stable so that the remainder of the MSE may be completed by another provider.  General surgery is down to see the patient.  Patient is stable here in the emergency department.  Patient was evaluated at an urgent care and found to have appendicitis on the CT scan and was sent to the emergency department.  Carlyle Dolly, PA-C 11/30/13 1418

## 2013-12-01 ENCOUNTER — Encounter (HOSPITAL_COMMUNITY): Payer: Self-pay | Admitting: General Surgery

## 2013-12-01 LAB — CBC
MCH: 30 pg (ref 26.0–34.0)
MCHC: 33.5 g/dL (ref 30.0–36.0)
MCV: 89.6 fL (ref 78.0–100.0)
Platelets: 205 10*3/uL (ref 150–400)
RBC: 3.83 MIL/uL — ABNORMAL LOW (ref 3.87–5.11)

## 2013-12-01 LAB — MAGNESIUM: Magnesium: 1.6 mg/dL (ref 1.5–2.5)

## 2013-12-01 LAB — BASIC METABOLIC PANEL
Calcium: 8.8 mg/dL (ref 8.4–10.5)
Chloride: 96 mEq/L (ref 96–112)
GFR calc Af Amer: 84 mL/min — ABNORMAL LOW (ref >90)
GFR calc non Af Amer: 73 mL/min — ABNORMAL LOW (ref >90)
Glucose, Bld: 149 mg/dL — ABNORMAL HIGH (ref 70–99)
Potassium: 3.6 mEq/L (ref 3.5–5.1)
Sodium: 133 mEq/L — ABNORMAL LOW (ref 135–145)

## 2013-12-01 MED ORDER — MAGNESIUM SULFATE IN D5W 10-5 MG/ML-% IV SOLN
1.0000 g | Freq: Once | INTRAVENOUS | Status: DC
  Filled 2013-12-01: qty 100

## 2013-12-01 NOTE — Progress Notes (Signed)
Patient interviewed and examined, agree with PA note above.  Mariella Saa MD, FACS  12/01/2013 5:03 PM

## 2013-12-01 NOTE — Progress Notes (Signed)
1 Day Post-Op  Subjective: She is pretty sore and no flatus so far.    Objective: Vital signs in last 24 hours: Temp:  [97.6 F (36.4 C)-100.6 F (38.1 C)] 99 F (37.2 C) (12/10 0949) Pulse Rate:  [89-96] 94 (12/10 0949) Resp:  [17-20] 18 (12/10 0949) BP: (96-151)/(51-77) 120/77 mmHg (12/10 0949) SpO2:  [91 %-100 %] 91 % (12/10 0949) Weight:  [77.111 kg (170 lb)] 77.111 kg (170 lb) (12/09 1500) Last BM Date: 11/28/13 Npo, ongoing low grade temp 99 range WBC is up 17.5 Intake/Output from previous day: 12/09 0701 - 12/10 0700 In: 2943.8 [I.V.:2893.8; IV Piggyback:50] Out: 425 [Urine:400; Blood:25] Intake/Output this shift:    General appearance: alert, cooperative and no distress Resp: clear to auscultation bilaterally GI: soft sore, no bowel sounds taking ice chips, sites look ok  Lab Results:   Recent Labs  11/30/13 1330 12/01/13 0556  WBC 15.0* 17.5*  HGB 13.0 11.5*  HCT 38.9 34.3*  PLT 245 205    BMET  Recent Labs  11/30/13 1330 12/01/13 0556  NA 130* 133*  K 2.9* 3.6  CL 91* 96  CO2 25 30  GLUCOSE 130* 149*  BUN 11 13  CREATININE 0.69 0.86  CALCIUM 9.6 8.8   PT/INR No results found for this basename: LABPROT, INR,  in the last 72 hours  No results found for this basename: AST, ALT, ALKPHOS, BILITOT, PROT, ALBUMIN,  in the last 168 hours   Lipase  No results found for this basename: lipase     Studies/Results: No results found.  Medications: . heparin subcutaneous  5,000 Units Subcutaneous Q8H  . levothyroxine  50 mcg Oral Daily  . piperacillin-tazobactam (ZOSYN)  IV  3.375 g Intravenous Q8H    Assessment/Plan 1. Perforated appendicitis, s/p Laparoscopic appendectomy, 11/30/2013,  Adolph Pollack, MD 2. Hypertension  3. Hypothyroid  Plan:  Start some clears, but go slow, continue IV fluids and antibiotics.  recheck labs in AM.    LOS: 1 day    Sara Ward 12/01/2013

## 2013-12-01 NOTE — Care Management Note (Signed)
    Page 1 of 1   12/01/2013     1:29:13 PM   CARE MANAGEMENT NOTE 12/01/2013  Patient:  BRYTON, WAIGHT   Account Number:  [id number]  Date Initiated:  12/01/2013  Documentation initiated by:  Lorenda Ishihara  Subjective/Objective Assessment:   57 yo female admitted s/p lap appy. PTA lived at home with spouse.     Action/Plan:   Home when stable   Anticipated DC Date:  12/02/2013   Anticipated DC Plan:  HOME/SELF CARE      DC Planning Services  CM consult      Choice offered to / List presented to:             Status of service:  Completed, signed off Medicare Important Message given?   (If response is "NO", the following Medicare IM given date fields will be blank) Date Medicare IM given:   Date Additional Medicare IM given:    Discharge Disposition:  HOME/SELF CARE  Per UR Regulation:  Reviewed for med. necessity/level of care/duration of stay  If discussed at Long Length of Stay Meetings, dates discussed:    Comments:

## 2013-12-02 LAB — BASIC METABOLIC PANEL
BUN: 8 mg/dL (ref 6–23)
CO2: 28 mEq/L (ref 19–32)
Chloride: 100 mEq/L (ref 96–112)
Creatinine, Ser: 0.78 mg/dL (ref 0.50–1.10)
Glucose, Bld: 137 mg/dL — ABNORMAL HIGH (ref 70–99)

## 2013-12-02 LAB — CBC
HCT: 34 % — ABNORMAL LOW (ref 36.0–46.0)
Hemoglobin: 11.2 g/dL — ABNORMAL LOW (ref 12.0–15.0)
MCH: 29.9 pg (ref 26.0–34.0)
MCV: 90.7 fL (ref 78.0–100.0)
RBC: 3.75 MIL/uL — ABNORMAL LOW (ref 3.87–5.11)
RDW: 13.2 % (ref 11.5–15.5)

## 2013-12-02 MED ORDER — OXYCODONE HCL 5 MG/5ML PO SOLN
10.0000 mg | ORAL | Status: DC | PRN
  Administered 2013-12-02 – 2013-12-03 (×4): 10 mg via ORAL
  Filled 2013-12-02 (×4): qty 10

## 2013-12-02 MED ORDER — ACETAMINOPHEN 160 MG/5ML PO SOLN: 650.0000 mg | ORAL | Status: DC | PRN

## 2013-12-02 MED ORDER — KETOROLAC TROMETHAMINE 30 MG/ML IJ SOLN
30.0000 mg | Freq: Four times a day (QID) | INTRAMUSCULAR | Status: DC
  Administered 2013-12-02 – 2013-12-04 (×8): 30 mg via INTRAVENOUS
  Filled 2013-12-02 (×14): qty 1

## 2013-12-02 NOTE — Progress Notes (Signed)
Pt was referred for chaplain consult by nurse. Chaplain visited pt who had a family member with her. Pt. Stated that she felt better today than she did on last night. Pt did not need any services at this time. Chaplain advised pt if services were needed to have nurse to contact chaplain.   Cindie Crumbly, chaplain

## 2013-12-02 NOTE — Progress Notes (Deleted)
Pt was referred for spiritual consult by nurse during huddle. Chaplain visited pt who was in a good mood and welcomed the visit from the chaplain. Pt requested prayer. Chaplain provided prayer and spiritual care. Pt expressed gratitude that prayer was offered.    Cindie Crumbly, [address]

## 2013-12-02 NOTE — Progress Notes (Signed)
2 Days Post-Op  Subjective: She feels pretty bad, having pain and soreness.  No flatus so far, says she's burping.    Objective: Vital signs in last 24 hours: Temp:  [98.2 F (36.8 C)-98.8 F (37.1 C)] 98.8 F (37.1 C) (12/11 0550) Pulse Rate:  [97-103] 97 (12/11 0550) Resp:  [18] 18 (12/11 0550) BP: (125-141)/(71-81) 139/81 mmHg (12/11 0550) SpO2:  [92 %-98 %] 98 % (12/11 0550) Last BM Date: 11/28/13 1220 PO recorded;  Diet- clears NO BM recorded. Afebrile, still having some tachycardia up to 100 range WBC 15.5, starting to improve Intake/Output from previous day: 12/10 0701 - 12/11 0700 In: 3870 [P.O.:1220; I.V.:2600; IV Piggyback:50] Out: 1750 [Urine:1750] Intake/Output this shift: Total I/O In: 240 [P.O.:240] Out: 400 [Urine:400]  General appearance: alert, cooperative and no distress Resp: clear to auscultation bilaterally GI: soft, tender still distended a little, sites are fine, she is just having allot of discomfort still.  Lab Results:   Recent Labs  12/01/13 0556 12/02/13 0557  WBC 17.5* 15.5*  HGB 11.5* 11.2*  HCT 34.3* 34.0*  PLT 205 199    BMET  Recent Labs  12/01/13 0556 12/02/13 0557  NA 133* 135  K 3.6 3.6  CL 96 100  CO2 30 28  GLUCOSE 149* 137*  BUN 13 8  CREATININE 0.86 0.78  CALCIUM 8.8 8.8   PT/INR No results found for this basename: LABPROT, INR,  in the last 72 hours  No results found for this basename: AST, ALT, ALKPHOS, BILITOT, PROT, ALBUMIN,  in the last 168 hours   Lipase  No results found for this basename: lipase     Studies/Results: No results found.  Medications: . heparin subcutaneous  5,000 Units Subcutaneous Q8H  . levothyroxine  50 mcg Oral Daily  . piperacillin-tazobactam (ZOSYN)  IV  3.375 g Intravenous Q8H    Assessment/Plan 1. Perforated appendicitis, s/p Laparoscopic appendectomy, 11/30/2013, Adolph Pollack, MD  2. Hypertension  3. Hypothyroid      Plan:  She still has a WBC elevation and  very tender and sore.  No bowel sounds.  Will keep her on clears and IV fluids, IV antibiotics. Add some toradol, she is walking with her husband.   LOS: 2 days    Sara Ward 12/02/2013

## 2013-12-02 NOTE — Progress Notes (Signed)
Patient interviewed and examined, agree with PA note above. She is feeling somewhat better this afternoon as I am seeing her. Still moderately tender in the lower abdomen and white count is down from yesterday but still elevated. Continue IV antibiotics and close monitoring.  Mariella Saa MD, FACS  12/02/2013 4:08 PM

## 2013-12-03 LAB — CBC
HCT: 30.6 % — ABNORMAL LOW (ref 36.0–46.0)
Hemoglobin: 10.1 g/dL — ABNORMAL LOW (ref 12.0–15.0)
MCH: 30 pg (ref 26.0–34.0)
MCHC: 33 g/dL (ref 30.0–36.0)
MCV: 90.8 fL (ref 78.0–100.0)
Platelets: 198 10*3/uL (ref 150–400)
RBC: 3.37 MIL/uL — ABNORMAL LOW (ref 3.87–5.11)

## 2013-12-03 LAB — URINE CULTURE
Colony Count: NO GROWTH
Culture: NO GROWTH

## 2013-12-03 LAB — BASIC METABOLIC PANEL
BUN: 8 mg/dL (ref 6–23)
CO2: 27 mEq/L (ref 19–32)
Calcium: 8.8 mg/dL (ref 8.4–10.5)
Chloride: 105 mEq/L (ref 96–112)
GFR calc non Af Amer: 79 mL/min — ABNORMAL LOW (ref >90)
Glucose, Bld: 135 mg/dL — ABNORMAL HIGH (ref 70–99)

## 2013-12-03 NOTE — Progress Notes (Signed)
Patient interviewed and examined, agree with PA note above. I expect she could be discharged home tomorrow and complete a course of oral antibiotic  Mariella Saa MD, FACS  12/03/2013 6:03 PM

## 2013-12-03 NOTE — Progress Notes (Signed)
3 Days Post-Op  Subjective: Feeling better, passing gas, Marked improvement, wants something beyond clears.  Objective: Vital signs in last 24 hours: Temp:  [97.7 F (36.5 C)-98.1 F (36.7 C)] 98.1 F (36.7 C) (12/12 0626) Pulse Rate:  [87-93] 87 (12/12 0626) Resp:  [18] 18 (12/12 0626) BP: (112-143)/(73-86) 112/73 mmHg (12/12 0626) SpO2:  [90 %-92 %] 90 % (12/12 0626) Last BM Date: 11/28/13 1800 PO  Diet:  Clears Afebrile, VSS WBC is back to normal Intake/Output from previous day: 12/11 0701 - 12/12 0700 In: 4200 [P.O.:1800; I.V.:2300; IV Piggyback:100] Out: 4150 [Urine:4150] Intake/Output this shift:    General appearance: alert cooperative , ready for more food. GI: soft, less tender +BS and flatus  Lab Results:   Recent Labs  12/02/13 0557 12/03/13 0517  WBC 15.5* 10.0  HGB 11.2* 10.1*  HCT 34.0* 30.6*  PLT 199 198    BMET  Recent Labs  12/02/13 0557 12/03/13 0517  NA 135 139  K 3.6 3.9  CL 100 105  CO2 28 27  GLUCOSE 137* 135*  BUN 8 8  CREATININE 0.78 0.80  CALCIUM 8.8 8.8   PT/INR No results found for this basename: LABPROT, INR,  in the last 72 hours  No results found for this basename: AST, ALT, ALKPHOS, BILITOT, PROT, ALBUMIN,  in the last 168 hours   Lipase  No results found for this basename: lipase     Studies/Results: No results found.  Medications: . heparin subcutaneous  5,000 Units Subcutaneous Q8H  . ketorolac  30 mg Intravenous Q6H  . levothyroxine  50 mcg Oral Daily  . piperacillin-tazobactam (ZOSYN)  IV  3.375 g Intravenous Q8H    Assessment/Plan 1. Perforated appendicitis, s/p Laparoscopic appendectomy, 11/30/2013, Sara Pollack, MD  2. Hypertension  3. Hypothyroid   Plan Full liquids, and advance to low fiber tonight if she does well, continue IV antibiotics, decrease IV fluids.   LOS: 3 days    Sara Ward 12/03/2013

## 2013-12-04 LAB — CBC
HCT: 30.6 % — ABNORMAL LOW (ref 36.0–46.0)
MCH: 29.9 pg (ref 26.0–34.0)
MCHC: 33.3 g/dL (ref 30.0–36.0)
MCV: 89.7 fL (ref 78.0–100.0)
Platelets: 235 10*3/uL (ref 150–400)
RBC: 3.41 MIL/uL — ABNORMAL LOW (ref 3.87–5.11)
WBC: 8.3 10*3/uL (ref 4.0–10.5)

## 2013-12-04 MED ORDER — OXYCODONE-ACETAMINOPHEN 5-325 MG PO TABS: 1.0000 | ORAL_TABLET | Freq: Four times a day (QID) | ORAL | Status: DC | PRN

## 2013-12-04 MED ORDER — DOCUSATE SODIUM 100 MG PO CAPS: 100.0000 mg | ORAL_CAPSULE | Freq: Two times a day (BID) | ORAL | Status: DC

## 2013-12-04 MED ORDER — AMOXICILLIN-POT CLAVULANATE 875-125 MG PO TABS
1.0000 | ORAL_TABLET | Freq: Two times a day (BID) | ORAL | Status: DC
  Administered 2013-12-04: 1 via ORAL
  Filled 2013-12-04 (×2): qty 1

## 2013-12-04 MED ORDER — AMOXICILLIN-POT CLAVULANATE 875-125 MG PO TABS: 1.0000 | ORAL_TABLET | Freq: Two times a day (BID) | ORAL | Status: DC

## 2013-12-04 NOTE — Discharge Summary (Signed)
Physician Discharge Summary  Patient ID: Sara Ward MRN: [id number] DOB/AGE: 29-Mar-1956 57 y.o.  Admit date: 11/30/2013 Discharge date: 12/04/2013  Admission Diagnoses: Acute appendicitis with peritonitis  Discharge Diagnoses:  Active Problems:   Acute appendicitis with peritonitis   Discharged Condition: good  Hospital Course: The patient was admitted after appendectomy for ruptured appendicitis.  She remained in the hospital   Consults: None  Significant Diagnostic Studies: labs: cbc, chemistries   Treatments: IV hydration, antibiotics: Zosyn, analgesia: acetaminophen w/ codeine and surgery: lap appendectomy   Discharge Exam: Blood pressure 149/91, pulse 90, temperature 98 F (36.7 C), temperature source Oral, resp. rate 18, height 5\' 1"  (1.549 m), weight 170 lb (77.111 kg), SpO2 92.00%. General appearance: alert and cooperative GI: normal findings: soft, non-tender Incision/Wound: clean, dry, intact  Disposition: 01-Home or Self Care     Medication List         amoxicillin-clavulanate 875-125 MG per tablet  Commonly known as:  AUGMENTIN  Take 1 tablet by mouth every 12 (twelve) hours.     docusate sodium 100 MG capsule  Commonly known as:  COLACE  Take 1 capsule (100 mg total) by mouth 2 (two) times daily.     levothyroxine 50 MCG tablet  Commonly known as:  SYNTHROID, LEVOTHROID  Take 50 mcg by mouth daily.     oxyCODONE-acetaminophen 5-325 MG per tablet  Commonly known as:  ROXICET  Take 1-2 tablets by mouth every 6 (six) hours as needed for severe pain.     VAGIFEM 10 MCG Tabs vaginal tablet  Generic drug:  Estradiol  Take 1 application by mouth 2 (two) times a week.     valsartan-hydrochlorothiazide 160-25 MG per tablet  Commonly known as:  DIOVAN-HCT  Take 1 tablet by mouth daily.           Follow-up Information   Follow up with ROSENBOWER,TODD J, MD. Schedule an appointment as soon as possible for a visit in 2 weeks.   Specialty:  General  Surgery   Contact information:   [address] [place] [address] [phone]       Signed: Vanita Panda 12/04/2013, 10:29 AM

## 2013-12-04 NOTE — Progress Notes (Signed)
Assessment unchanged. Pt and husband verbalized understanding of dc instructions through teach back. Already has My Chart account. Scripts x 2 given to pt as provided by MD. Discharged via wc to front entrance to meet awaiting vehicle to carry home. Accompanied by NT and husband.

## 2013-12-29 ENCOUNTER — Ambulatory Visit (INDEPENDENT_AMBULATORY_CARE_PROVIDER_SITE_OTHER): Payer: 59 | Admitting: General Surgery

## 2013-12-29 ENCOUNTER — Encounter (INDEPENDENT_AMBULATORY_CARE_PROVIDER_SITE_OTHER): Payer: Self-pay | Admitting: General Surgery

## 2013-12-29 VITALS — BP 136/87 | HR 68 | Temp 97.6°F | Resp 16 | Ht 61.0 in | Wt 163.4 lb

## 2013-12-29 DIAGNOSIS — Z4889 Encounter for other specified surgical aftercare: Secondary | ICD-10-CM

## 2013-12-29 NOTE — Patient Instructions (Signed)
May resume normal activities as tolerated. Avoid over eating.

## 2013-12-29 NOTE — Progress Notes (Signed)
Procedure:  Laparoscopic appendectomy for perforated, gangrenous appendicitis  Date:  11/30/2013  Pathology:  Gangrenous appendicitis  History:  She is here for her first postoperative visit. She has had a slow, steady recovery. She went back to work a couple of days ago and is doing okay with that.  Exam: General- Is in NAD. Abdomen-soft, incisions are clean and intact.  Assessment:  Doing well overall. No clinical signs of recurrent infection.  Plan:  Resume normal activities as tolerated. Return visit as needed.

## 2014-04-11 ENCOUNTER — Encounter: Payer: Self-pay | Admitting: Family Medicine

## 2014-04-11 ENCOUNTER — Ambulatory Visit (INDEPENDENT_AMBULATORY_CARE_PROVIDER_SITE_OTHER): Payer: 59 | Admitting: Family Medicine

## 2014-04-11 VITALS — BP 148/90 | HR 75 | Temp 97.6°F | Ht 61.0 in | Wt 168.0 lb

## 2014-04-11 DIAGNOSIS — R0789 Other chest pain: Secondary | ICD-10-CM | POA: Insufficient documentation

## 2014-04-11 DIAGNOSIS — R071 Chest pain on breathing: Secondary | ICD-10-CM

## 2014-04-11 DIAGNOSIS — R109 Unspecified abdominal pain: Secondary | ICD-10-CM

## 2014-04-11 MED ORDER — LIDOCAINE HCL 2 % EX GEL: 1.0000 "application " | CUTANEOUS | Status: DC | PRN

## 2014-04-11 NOTE — Progress Notes (Signed)
   Subjective:    Patient ID: Sara Ward, female    DOB: 12/22/1956, 58 y.o.   MRN: [id number]  HPI  58 year old F with irritation under right breast.  Location: right flank Character: nagging, irritating pain Duration   Course  Associated symptoms  - rash on back which has resolved and itching  Exacerbating: bra, lifting grandson Alleviating: wearing work-out bra Treatments tried: aleve  Review of Systems Negative  - SOB, fever, chills, nausea, vomiting, breast swelling or tenderness     Objective:   Physical Exam BP 160/99  Pulse 75  Temp(Src) 97.6 F (36.4 C) (Oral)  Ht 5\' 1"  (1.549 m)  Wt 168 lb (76.204 kg)  BMI 31.76 kg/m2  Gen: middle aged white female, non-ill-appearing, pleasant Chest Wall: Normal appearance, no deformity, mild tenderness along the fifth and sixth rib but no visible rash Back: small erythematous scaly plaque that appears to be healing vesicles Abdominal : soft, nondistended, nontender Muscle skeletal: 5 of 5 strength of upper extremities     Assessment & Plan:

## 2014-04-11 NOTE — Patient Instructions (Signed)
Ms. Sara Ward,   Dear Mrs. Sara Ward,   Thank you for coming to clinic today. Please read below regarding the issues that we discussed.   1. Right flank pain - This could be a strained muscle or more likely an irritated nerve. The rash on your back looks similar to shingles, which can cause pain. I do not think there is anything wrong with your ribs or lungs. I suggest using a topical capsaicin cream 4 times a day. He can also use topical Xylocaine for the pain. Additionally, you can continue to use Aleve. If this is not improving within one week, then please return  2. High blood pressure and anemia - These are 2 things we will need to check at your upcoming visit.  Please follow up in clinic in 1 month. Please call earlier if you have any questions or concerns.   Sincerely,   Dr. Clinton SawyerWilliamson

## 2014-04-11 NOTE — Assessment & Plan Note (Signed)
A: no definitive etiology, most likely muscular skeletal strain of the latissimus intercostal muscles versus atypical presentation of shingles; no historical or physical evidence of pulmonary disease P: treat conservatively with Aleve, topical capsaicin and Xylocaine as if it is a shingles outbreak; follow up in 1 week when necessary

## 2014-05-18 ENCOUNTER — Encounter: Payer: 59 | Admitting: Family Medicine

## 2014-05-24 ENCOUNTER — Ambulatory Visit (INDEPENDENT_AMBULATORY_CARE_PROVIDER_SITE_OTHER): Payer: 59 | Admitting: Family Medicine

## 2014-05-24 ENCOUNTER — Encounter: Payer: Self-pay | Admitting: Family Medicine

## 2014-05-24 VITALS — BP 144/89 | HR 76 | Ht 61.0 in | Wt 172.0 lb

## 2014-05-24 DIAGNOSIS — E039 Hypothyroidism, unspecified: Secondary | ICD-10-CM

## 2014-05-24 DIAGNOSIS — E669 Obesity, unspecified: Secondary | ICD-10-CM

## 2014-05-24 DIAGNOSIS — I10 Essential (primary) hypertension: Secondary | ICD-10-CM

## 2014-05-24 DIAGNOSIS — Z0000 Encounter for general adult medical examination without abnormal findings: Secondary | ICD-10-CM

## 2014-05-24 LAB — COMPREHENSIVE METABOLIC PANEL
ALBUMIN: 4.4 g/dL (ref 3.5–5.2)
ALK PHOS: 133 U/L — AB (ref 39–117)
ALT: 42 U/L — ABNORMAL HIGH (ref 0–35)
AST: 30 U/L (ref 0–37)
BILIRUBIN TOTAL: 0.5 mg/dL (ref 0.2–1.2)
BUN: 15 mg/dL (ref 6–23)
CO2: 30 meq/L (ref 19–32)
Calcium: 10.1 mg/dL (ref 8.4–10.5)
Chloride: 101 mEq/L (ref 96–112)
Creat: 0.83 mg/dL (ref 0.50–1.10)
GLUCOSE: 94 mg/dL (ref 70–99)
Potassium: 3.8 mEq/L (ref 3.5–5.3)
SODIUM: 139 meq/L (ref 135–145)
Total Protein: 7.1 g/dL (ref 6.0–8.3)

## 2014-05-24 LAB — LIPID PANEL
CHOLESTEROL: 205 mg/dL — AB (ref 0–200)
HDL: 55 mg/dL (ref >39)
LDL Cholesterol: 130 mg/dL — ABNORMAL HIGH (ref 0–99)
TRIGLYCERIDES: 102 mg/dL (ref <150)
Total CHOL/HDL Ratio: 3.7 Ratio
VLDL: 20 mg/dL (ref 0–40)

## 2014-05-24 LAB — HEPATITIS C ANTIBODY: HCV Ab: NEGATIVE

## 2014-05-24 LAB — CBC
HEMATOCRIT: 41.3 % (ref 36.0–46.0)
HEMOGLOBIN: 14.2 g/dL (ref 12.0–15.0)
MCH: 29.9 pg (ref 26.0–34.0)
MCHC: 34.4 g/dL (ref 30.0–36.0)
MCV: 86.9 fL (ref 78.0–100.0)
Platelets: 279 10*3/uL (ref 150–400)
RBC: 4.75 MIL/uL (ref 3.87–5.11)
RDW: 13.2 % (ref 11.5–15.5)
WBC: 5.7 10*3/uL (ref 4.0–10.5)

## 2014-05-24 LAB — TSH: TSH: 3.136 u[IU]/mL (ref 0.350–4.500)

## 2014-05-24 NOTE — Patient Instructions (Signed)
Dear Sara Ward,   Thank you for coming to clinic today. Please read below regarding the issues that we discussed.   1. Labs - I will let you know the results and call you if there is anything to follow up on.   2. High Blood Pressure - Continue your current medication and increase her exercise and consider a low salt diet. This will help control her blood pressure and reduce the risk of stroke.   3. Colon Cancer Screening - You need a colonoscopy as soon as you can take a day off of work. Please call [facility] or [facility].   Please follow up in clinic in 6 months. Please call earlier if you have any questions or concerns.   Sincerely,   Dr. Clinton Sawyer

## 2014-05-24 NOTE — Progress Notes (Signed)
   Subjective:    Patient ID: Sara Ward, female    DOB: 1956-05-18, 58 y.o.   MRN: [id number]  HPI  58 year old F with hx of HTN who presents for wellness exam.   Cancer screenings A: Cervical - Performed by Pamelia Hoit, NP at OB-GYN office; pt reports normal PAP within the last year B. Colon - never had one and willing to have one when she can take time off of work C. Breast - last mammogram in Sept 2013 which was normal, denies any current breast masses   Vaccinations  - Needs TDAP, but patient states that she had one 3-4 years ago when she started working at [employer] in 2012 - Needs shingles vaccine   Current Outpatient Prescriptions on File Prior to Visit  Medication Sig Dispense Refill  . levothyroxine (SYNTHROID, LEVOTHROID) 50 MCG tablet Take 50 mcg by mouth daily.        Marland Kitchen lidocaine (XYLOCAINE) 2 % jelly Apply 1 application topically as needed.  30 mL  1  . VAGIFEM 10 MCG TABS vaginal tablet Place 1 application vaginally 2 (two) times a week.       . valsartan-hydrochlorothiazide (DIOVAN-HCT) 160-25 MG per tablet Take 1 tablet by mouth daily.       No current facility-administered medications on file prior to visit.     Review of Systems Positive for weight gain due to dietary indiscretions and lack of exercise  Negative for breast masses or drainage, chest pain, constipation, melena, hematochezia, weight loss, arthralgias, rashes, depression, anxiety     Objective:   Physical Exam BP 144/89  Pulse 76  Ht 5\' 1"  (1.549 m)  Wt 172 lb (78.019 kg)  BMI 32.52 kg/m2  Wt Readings from Last 5 Encounters:  05/24/14 172 lb (78.019 kg)  04/11/14 168 lb (76.204 kg)  12/29/13 163 lb 6.4 oz (74.118 kg)  11/30/13 170 lb (77.111 kg)  11/30/13 170 lb (77.111 kg)    Gen: middle age white female, well appearing, NAD, pleasant and conversant, obese HEENT: NCAT, PERRLA, EOMI, OP clear and moist, no oropharyngeal exudate, no lymphadenopathy, no thyroid tenderness, enlargement,  or nodules, neck with normal ROM, no meningismus, TM reflective without effusion  CV: RRR, no m/r/g, no JVD or carotid bruits Pulm: normal WOB, CTA-B Abd: soft, NDNT, NABS Extremities: no edema or joint tenderness Skin: warm, dry, no rashes Neuro/Psych: A&Ox4, normal affect, speech, and thought content     Assessment & Plan:

## 2014-05-26 ENCOUNTER — Encounter: Payer: Self-pay | Admitting: Family Medicine

## 2014-05-26 DIAGNOSIS — E669 Obesity, unspecified: Secondary | ICD-10-CM | POA: Insufficient documentation

## 2014-05-26 DIAGNOSIS — E66811 Obesity, class 1: Secondary | ICD-10-CM | POA: Insufficient documentation

## 2014-05-26 DIAGNOSIS — Z0000 Encounter for general adult medical examination without abnormal findings: Secondary | ICD-10-CM | POA: Insufficient documentation

## 2014-05-26 NOTE — Assessment & Plan Note (Signed)
A: normal BP on diovan, but no adherence to diet or exercise P: continue current med, increase exercise and start low Na+ diet

## 2014-05-26 NOTE — Assessment & Plan Note (Signed)
A: unclear if patient truly has hypothyroidism, no symptoms and no hx of thyroid surgery P: check TSH, adjust levothyroxine as needed

## 2014-05-26 NOTE — Assessment & Plan Note (Signed)
Needs colon cancer screening. Pt agreeable to this and will set up appointment.

## 2014-05-30 ENCOUNTER — Encounter: Payer: Self-pay | Admitting: Family Medicine

## 2014-05-30 DIAGNOSIS — E559 Vitamin D deficiency, unspecified: Secondary | ICD-10-CM | POA: Insufficient documentation

## 2014-05-30 DIAGNOSIS — N952 Postmenopausal atrophic vaginitis: Secondary | ICD-10-CM | POA: Insufficient documentation

## 2014-05-30 DIAGNOSIS — R7303 Prediabetes: Secondary | ICD-10-CM | POA: Insufficient documentation

## 2014-08-12 ENCOUNTER — Other Ambulatory Visit: Payer: Self-pay | Admitting: *Deleted

## 2014-08-12 MED ORDER — VALSARTAN-HYDROCHLOROTHIAZIDE 160-25 MG PO TABS: 1.0000 | ORAL_TABLET | Freq: Every day | ORAL | Status: DC

## 2014-08-12 NOTE — Telephone Encounter (Signed)
Please contact patient to let her know prescription has been sent to the pharmacy.  Also please ask the patient whether or not she has set up an appointment for her colonoscopy. She was asked to do so at her last appointment in June of this year.  Thank you!

## 2014-08-15 NOTE — Telephone Encounter (Signed)
Pt has not scheduled appointment for colonoscopy at this time.  Pt stated it will be a while because she is unemployed. Pt is aware of medication refill.  Clovis Pu, RN

## 2014-12-05 ENCOUNTER — Other Ambulatory Visit: Payer: Self-pay | Admitting: Family Medicine

## 2014-12-05 NOTE — Telephone Encounter (Signed)
Pt stated she is not due for a physical once a year.  She can not afford come in for an appt unless it is for her physical.  Explained to pt this a residency program and each year this will be new doctors.  Pt stated it is not her fault her last doctor left and we get new doctors.  Her insurance will not cover for to come in unless it is for a physical once a year.  Pt stated the doctor need to work it that out, she can not afford spending extra money on a copay.  Tried to explain to patient again that her new doctor wanted to meet her, but she stated again we need to work that out.  Clovis PuMartin, Tamika L, RN

## 2014-12-05 NOTE — Telephone Encounter (Signed)
I would like to meet this patient sometime in the next month or two if I have availability in my schedule.  Can you ask her to make an appointment when convenient?  Thank you!

## 2015-03-02 ENCOUNTER — Other Ambulatory Visit: Payer: Self-pay | Admitting: Family Medicine

## 2015-03-03 NOTE — Telephone Encounter (Signed)
I'd like to see this patient for a BP check sometime soon.  Thanks!

## 2015-04-01 ENCOUNTER — Other Ambulatory Visit: Payer: Self-pay | Admitting: Family Medicine

## 2015-07-31 ENCOUNTER — Telehealth: Payer: Self-pay | Admitting: Family Medicine

## 2015-07-31 NOTE — Telephone Encounter (Signed)
Pt calling and is saying that she is having headaches and her Blood Pressure was elevated ay an OB/GYN appt. She wanted to come in for a physical, got her set up at the earliest slot available. Would like to speak to PCP about increasing her BP medication dosage. Sadie Reynolds, ASA

## 2015-08-02 NOTE — Telephone Encounter (Signed)
Called patient. Got V-mail. Left message stating to call back if needed, otherwise looked forward to meeting her at the end of the month.

## 2015-08-23 ENCOUNTER — Ambulatory Visit (INDEPENDENT_AMBULATORY_CARE_PROVIDER_SITE_OTHER): Payer: Managed Care, Other (non HMO) | Admitting: Family Medicine

## 2015-08-23 VITALS — BP 164/87 | HR 74 | Temp 98.1°F | Ht 61.0 in | Wt 178.0 lb

## 2015-08-23 DIAGNOSIS — Z0000 Encounter for general adult medical examination without abnormal findings: Secondary | ICD-10-CM

## 2015-08-23 DIAGNOSIS — R7309 Other abnormal glucose: Secondary | ICD-10-CM | POA: Diagnosis not present

## 2015-08-23 DIAGNOSIS — I10 Essential (primary) hypertension: Secondary | ICD-10-CM

## 2015-08-23 DIAGNOSIS — E039 Hypothyroidism, unspecified: Secondary | ICD-10-CM | POA: Diagnosis not present

## 2015-08-23 DIAGNOSIS — Z79899 Other long term (current) drug therapy: Secondary | ICD-10-CM

## 2015-08-23 DIAGNOSIS — R7303 Prediabetes: Secondary | ICD-10-CM

## 2015-08-23 MED ORDER — VALSARTAN-HYDROCHLOROTHIAZIDE 320-25 MG PO TABS: 1.0000 | ORAL_TABLET | Freq: Every day | ORAL | Status: DC

## 2015-08-23 MED ORDER — ASPIRIN 81 MG PO TABS: 81.0000 mg | ORAL_TABLET | Freq: Every day | ORAL | Status: AC

## 2015-08-23 NOTE — Patient Instructions (Signed)

## 2015-08-26 DIAGNOSIS — Z79899 Other long term (current) drug therapy: Secondary | ICD-10-CM | POA: Insufficient documentation

## 2015-08-26 NOTE — Assessment & Plan Note (Signed)
BP 164/87 in office. Patient reports good med compliance. Patient is currently on Diovan. Will increase the dose of valsartan in this medication to . I will keep HCTZ at  dose.  - F/u in a few weeks for BP recheck and med tolerance assessment.

## 2015-08-26 NOTE — Assessment & Plan Note (Signed)
Will obtain Labs, will make a future order 2/2 need for fasting lipid panel >> will collect all at that time. - CMP/CBC/Lipid panel/TSH

## 2015-08-26 NOTE — Progress Notes (Signed)
Sara Hillier, MD, MS Phone: [phone]  Subjective:  Chief complaint -- Annual Physical  Patient here for annual physical. Denies any significant issues at this time. Some occasional symptoms of acid reflux. Has taken OTC Zantac for this w/ good relief and was wondering for this was OK to take on a semi-regular basis.   Some recent pain in Lt great toe. Has changed her shoe wear w/ some relief to this discomfort. Does not inhibit activities. Patient noticed pain started when she began increasing her weekly exercise. Pain does not radiate. Not history of trauma. No swelling, erythema, ecchymosis.     Colonoscopy: Due  Pneumonia Vaccine: no  Mammogram: yes Menopause: yes  Vaginal Bleeding: no  Tobacco Use: no   Alcohol Use: yes, minimal  Other Drugs: no   Support Structure: yes  Life at Home: good.  See HPI for ROS, otherwise 12pt ROS reviewed and neg.  Past Medical History Patient Active Problem List   Diagnosis Date Noted  . Encounter for long-term (current) use of medications 08/26/2015  . Atrophic vaginitis 05/30/2014  . Pre-diabetes 05/30/2014  . Unspecified vitamin D deficiency 05/30/2014  . Obesity (BMI 30.0-34.9) 05/26/2014  . Encounter for preventive health examination 05/26/2014  . Hypothyroidism 12/04/2011  . Pain in toe of right foot 12/04/2011  . Hypertension 11/04/2011    Medications- reviewed and updated Current Outpatient Prescriptions  Medication Sig Dispense Refill  . aspirin 81 MG tablet Take 1 tablet (81 mg total) by mouth daily. 30 tablet   . levothyroxine (SYNTHROID, LEVOTHROID) 50 MCG tablet Take 50 mcg by mouth daily.      Marland Kitchen lidocaine (XYLOCAINE) 2 % jelly Apply 1 application topically as needed. 30 mL 1  . VAGIFEM 10 MCG TABS vaginal tablet Place 1 application vaginally 2 (two) times a week.     . valsartan-hydrochlorothiazide (DIOVAN HCT) 320-25 MG per tablet Take 1 tablet by mouth daily. 30 tablet 3   No current facility-administered  medications for this visit.    Objective: BP 164/87 mmHg  Pulse 74  Temp(Src) 98.1 F (36.7 C) (Oral)  Ht 5\' 1"  (1.549 m)  Wt 178 lb (80.74 kg)  BMI 33.65 kg/m2 Gen: NAD, alert, cooperative with exam HEENT: NCAT, EOMI, PERRL CV: RRR, good S1/S2, no murmur Resp: CTABL, no wheezes, non-labored Abd: Soft, Non Tender, Non Distended, BS present, no guarding or organomegaly Ext: No edema, warm, some TTP over Lt EHL tendon. No bony tenderness. Gait normal. Strength/sensation intact throughout.  Neuro: Alert and oriented, No gross deficits   Assessment/Plan:  Hypertension BP 164/87 in office. Patient reports good med compliance. Patient is currently on Diovan. Will increase the dose of valsartan in this medication to 320mg . I will keep HCTZ at 25mg  dose.  - F/u in a few weeks for BP recheck and med tolerance assessment.  Encounter for long-term (current) use of medications Will obtain Labs, will make a future order 2/2 need for fasting lipid panel >> will collect all at that time. - CMP/CBC/Lipid panel/TSH    Orders Placed This Encounter  Procedures  . CBC    Standing Status: Future     Number of Occurrences:      Standing Expiration Date: 08/22/2016  . Comprehensive metabolic panel    Standing Status: Future     Number of Occurrences:      Standing Expiration Date: 08/22/2016  . Hemoglobin A1c    Standing Status: Future     Number of Occurrences:      Standing  Expiration Date: 08/22/2016  . Lipid panel    Standing Status: Future     Number of Occurrences:      Standing Expiration Date: 08/22/2016  . TSH    Standing Status: Future     Number of Occurrences:      Standing Expiration Date: 08/22/2016  . HIV antibody    Standing Status: Future     Number of Occurrences:      Standing Expiration Date: 08/22/2016    Meds ordered this encounter  Medications  . valsartan-hydrochlorothiazide (DIOVAN HCT) 320-25 MG per tablet    Sig: Take 1 tablet by mouth daily.    Dispense:   30 tablet    Refill:  3  . aspirin 81 MG tablet    Sig: Take 1 tablet (81 mg total) by mouth daily.    Dispense:  30 tablet

## 2015-09-01 ENCOUNTER — Other Ambulatory Visit (INDEPENDENT_AMBULATORY_CARE_PROVIDER_SITE_OTHER): Payer: Managed Care, Other (non HMO)

## 2015-09-01 DIAGNOSIS — E039 Hypothyroidism, unspecified: Secondary | ICD-10-CM

## 2015-09-01 DIAGNOSIS — R7309 Other abnormal glucose: Secondary | ICD-10-CM

## 2015-09-01 DIAGNOSIS — Z0000 Encounter for general adult medical examination without abnormal findings: Secondary | ICD-10-CM

## 2015-09-01 DIAGNOSIS — R7303 Prediabetes: Secondary | ICD-10-CM

## 2015-09-01 LAB — COMPREHENSIVE METABOLIC PANEL
ALBUMIN: 4.1 g/dL (ref 3.6–5.1)
ALT: 44 U/L — ABNORMAL HIGH (ref 6–29)
AST: 29 U/L (ref 10–35)
Alkaline Phosphatase: 133 U/L — ABNORMAL HIGH (ref 33–130)
BILIRUBIN TOTAL: 0.6 mg/dL (ref 0.2–1.2)
BUN: 13 mg/dL (ref 7–25)
CALCIUM: 9.6 mg/dL (ref 8.6–10.4)
CHLORIDE: 103 mmol/L (ref 98–110)
CO2: 30 mmol/L (ref 20–31)
Creat: 0.78 mg/dL (ref 0.50–0.99)
GLUCOSE: 99 mg/dL (ref 65–99)
Potassium: 4.1 mmol/L (ref 3.5–5.3)
Sodium: 140 mmol/L (ref 135–146)
Total Protein: 6.7 g/dL (ref 6.1–8.1)

## 2015-09-01 LAB — LIPID PANEL
Cholesterol: 189 mg/dL (ref 125–200)
HDL: 60 mg/dL (ref >=46)
LDL CALC: 111 mg/dL (ref <130)
Total CHOL/HDL Ratio: 3.2 Ratio (ref <=5.0)
Triglycerides: 89 mg/dL (ref <150)
VLDL: 18 mg/dL (ref <30)

## 2015-09-01 LAB — CBC
HCT: 41.3 % (ref 36.0–46.0)
HEMOGLOBIN: 14 g/dL (ref 12.0–15.0)
MCH: 29.9 pg (ref 26.0–34.0)
MCHC: 33.9 g/dL (ref 30.0–36.0)
MCV: 88.2 fL (ref 78.0–100.0)
MPV: 10.6 fL (ref 8.6–12.4)
Platelets: 288 10*3/uL (ref 150–400)
RBC: 4.68 MIL/uL (ref 3.87–5.11)
RDW: 13.1 % (ref 11.5–15.5)
WBC: 6.2 10*3/uL (ref 4.0–10.5)

## 2015-09-01 LAB — TSH: TSH: 3.329 u[IU]/mL (ref 0.350–4.500)

## 2015-09-01 LAB — HIV ANTIBODY (ROUTINE TESTING W REFLEX): HIV 1&2 Ab, 4th Generation: NONREACTIVE

## 2015-09-01 LAB — POCT GLYCOSYLATED HEMOGLOBIN (HGB A1C): Hemoglobin A1C: 5.9

## 2015-09-01 NOTE — Progress Notes (Signed)
Labs done today Sara Ward 

## 2015-09-05 ENCOUNTER — Encounter: Payer: Self-pay | Admitting: Family Medicine

## 2015-10-09 ENCOUNTER — Ambulatory Visit (INDEPENDENT_AMBULATORY_CARE_PROVIDER_SITE_OTHER): Payer: Managed Care, Other (non HMO) | Admitting: Family Medicine

## 2015-10-09 ENCOUNTER — Encounter: Payer: Self-pay | Admitting: Family Medicine

## 2015-10-09 VITALS — BP 135/73 | HR 76 | Temp 97.8°F | Ht 61.0 in | Wt 177.0 lb

## 2015-10-09 DIAGNOSIS — I10 Essential (primary) hypertension: Secondary | ICD-10-CM | POA: Diagnosis not present

## 2015-10-09 NOTE — Patient Instructions (Signed)
It was a pleasure seeing you today in our clinic. Today we discussed your blood pressure. Here is the treatment plan we have discussed and agreed upon together:   - Continue taking your blood pressure medication as prescribed.  - Try to stay motivated with your weight loss! Make a habit (21 days) of some good eating routines, and try to "spice up" your workouts with new exercises and activities. - Call our office with any questions

## 2015-10-09 NOTE — Progress Notes (Signed)
   HPI  CC: Blood pressure check Patient is here for a follow-up on her blood pressure. At our last visit I had increased her valsartan dosage. She denies any adverse side effects from this change. She denies any symptoms of hypotension. She states that she feels well and has been trying to stay motivated in an attempt to lose weight. No further issues at this time.  ROS: Denies headache, blurred vision, dizziness, confusion, balance issues, shortness of breath, chest pain, swelling.  Objective: BP 135/73 mmHg  Pulse 76  Temp(Src) 97.8 F (36.6 C) (Oral)  Ht 5\' 1"  (1.549 m)  Wt 177 lb (80.287 kg)  BMI 33.46 kg/m2 Gen: NAD, alert, cooperative, and pleasant. HEENT: NCAT, EOMI, PERRL CV: RRR, no murmur Resp: CTAB, no wheezes, non-labored Ext: No edema, warm  Assessment and plan:  Hypertension Patient is tolerated change in medication regimen well. No current signs or symptoms of hypotension. - Continue medication regimen. - Encouraged weight loss at this time. We had a long discussion about different techniques and exercises which may benefit her attempts at weight loss. - Follow-up in 6 months.    Kathee DeltonIan D McKeag, MD,MS,  PGY2 10/09/2015 6:00 PM

## 2015-10-09 NOTE — Assessment & Plan Note (Addendum)
Patient is tolerated change in medication regimen well. No current signs or symptoms of hypotension. - Continue medication regimen. - Encouraged weight loss at this time. We had a long discussion about different techniques and exercises which may benefit her attempts at weight loss. - Follow-up in 6 months.

## 2015-11-27 ENCOUNTER — Other Ambulatory Visit: Payer: Self-pay | Admitting: Family Medicine

## 2016-01-25 ENCOUNTER — Other Ambulatory Visit: Payer: Self-pay | Admitting: Family Medicine

## 2016-06-24 ENCOUNTER — Other Ambulatory Visit: Payer: Self-pay | Admitting: Family Medicine

## 2016-10-31 ENCOUNTER — Other Ambulatory Visit: Payer: Self-pay | Admitting: Family Medicine

## 2016-12-02 ENCOUNTER — Other Ambulatory Visit: Payer: Self-pay | Admitting: Family Medicine

## 2017-03-10 ENCOUNTER — Telehealth: Payer: Self-pay | Admitting: Family Medicine

## 2017-03-10 NOTE — Telephone Encounter (Signed)
No answer. LMOVM. Called to confirm/remind patient about scheduled appointment and to advise to bring medications and to arrive early for check in. °

## 2017-03-14 ENCOUNTER — Ambulatory Visit (INDEPENDENT_AMBULATORY_CARE_PROVIDER_SITE_OTHER): Payer: Managed Care, Other (non HMO) | Admitting: Family Medicine

## 2017-03-14 ENCOUNTER — Encounter: Payer: Self-pay | Admitting: Family Medicine

## 2017-03-14 VITALS — BP 150/90 | HR 76 | Temp 98.3°F | Ht 61.0 in | Wt 169.0 lb

## 2017-03-14 DIAGNOSIS — Z0000 Encounter for general adult medical examination without abnormal findings: Secondary | ICD-10-CM

## 2017-03-14 DIAGNOSIS — R1011 Right upper quadrant pain: Secondary | ICD-10-CM | POA: Diagnosis not present

## 2017-03-14 DIAGNOSIS — I10 Essential (primary) hypertension: Secondary | ICD-10-CM

## 2017-03-14 DIAGNOSIS — E039 Hypothyroidism, unspecified: Secondary | ICD-10-CM | POA: Diagnosis not present

## 2017-03-14 DIAGNOSIS — Z79899 Other long term (current) drug therapy: Secondary | ICD-10-CM

## 2017-03-14 MED ORDER — VALSARTAN-HYDROCHLOROTHIAZIDE 320-25 MG PO TABS: 1.0000 | ORAL_TABLET | Freq: Every day | ORAL | 3 refills | Status: DC

## 2017-03-14 MED ORDER — AMLODIPINE BESYLATE 5 MG PO TABS: 5.0000 mg | ORAL_TABLET | Freq: Every day | ORAL | 3 refills | Status: DC

## 2017-03-14 MED ORDER — LEVOTHYROXINE SODIUM 50 MCG PO TABS: 50.0000 ug | ORAL_TABLET | Freq: Every day | ORAL | 3 refills | Status: DC

## 2017-03-14 NOTE — Assessment & Plan Note (Signed)
Stable: Patient currently on 50 g of Synthroid. She has no changes or concerns at this time. - Continue current regimen - Obtain TSH

## 2017-03-14 NOTE — Assessment & Plan Note (Signed)
Patient is here for her annual physical. She states that she feels overall very well but is a little concerned about her recent right upper quadrant abdominal pain. Patient has been attempting to lose weight and had a 8 pound weight loss since our last visit. No other issues at this time. - Obtain labs: CBC, CMP, lipid panel, TSH. - Encourage continue physical activity and low-fat high-fiber diet. - Follow-up one year

## 2017-03-14 NOTE — Assessment & Plan Note (Signed)
Patient is complaining of acute onset right upper quadrant pain. Currently asymptomatic. No recent fevers or other red flag symptoms. Possible association with food. Etiology unknown at this time however I'm currently considering the possibility of cholecystitis.  - CMP - Holding on abdominal ultrasound for now as she has had minimal symptoms recently and asymptomatic right now. However, I've informed patient that if she experiences any symptoms like this again and she is to be seen in our office within 24 hours. I also informed her that I would like to have her go to the ED if she develops any fever or nausea/vomiting when experiencing any of this abdominal discomfort.

## 2017-03-14 NOTE — Progress Notes (Signed)
Mickie HillierIan McKeag, MD, MS Phone: [phone]  Subjective:  CC -- Annual Physical;   Pt reports she has had some episodic right-sided abdominal pain. She states that approximately one month ago she had acute onset right upper quadrant pain that radiated to her back. Pain was described as burning/sharp. This episode lasted approximately 10 minutes and then quickly subsided. She states that this episode occurred 1-1.5 hours after lunch. She endorses some occasional pain since that time but no significant pain to the same caliber that she experienced one month ago. She denies any appreciation for an association with her meals or specific time of day. She denies any fever, chills, nausea, vomiting, diarrhea, melena, or hematochezia.   Cardiovascular: - Dx Hypertension: yes  - Dx Hyperlipidemia: no - Dx Obesity: yes, Class 1 - Physical Activity: yes, goes to the gym. Not always consistent, but tries to get there as often as possible.  - Diabetes: no  Cancer: Colorectal >> Colonoscopy: no, information provided today  Lung >> Tobacco Use: yes, in her teenage years. Smoked for a total of 1 year  - If so, previous Low-Dose CT screen: no  Breast >> Mammogram: yes, by OBGYN  Cervical/Endometrial >>  - Postmenopausal: yes  - Vaginal Bleeding: no - Pap Smear: by OBGYN   - Previous Abnormal Pap: no  Skin >> Suspicious lesions: no   Social: Alcohol Use: yes, very very minimal  Tobacco Use: see above   - Interested in Quitting: n/a  Other Drugs: no  Risky Sexual Behavior: no  Depression: no  Support and Life at Home: yes   Other: Osteoporosis: no   Zoster Vaccine: no   Flu Vaccine: yes, thru work  Pneumonia Vaccine: no   ROS-  Past Medical History Patient Active Problem List   Diagnosis Date Noted  . RUQ pain 03/14/2017  . Atrophic vaginitis 05/30/2014  . Pre-diabetes 05/30/2014  . Unspecified vitamin D deficiency 05/30/2014  . Obesity (BMI 30.0-34.9) 05/26/2014  . Encounter for  preventive health examination 05/26/2014  . Hypothyroidism 12/04/2011  . Hypertension 11/04/2011    Medications- reviewed and updated Current Outpatient Prescriptions  Medication Sig Dispense Refill  . amLODipine (NORVASC) 5 MG tablet Take 1 tablet (5 mg total) by mouth daily. 90 tablet 3  . aspirin 81 MG tablet Take 1 tablet (81 mg total) by mouth daily. 30 tablet   . levothyroxine (SYNTHROID, LEVOTHROID) 50 MCG tablet Take 1 tablet (50 mcg total) by mouth daily. 90 tablet 3  . lidocaine (XYLOCAINE) 2 % jelly Apply 1 application topically as needed. 30 mL 1  . VAGIFEM 10 MCG TABS vaginal tablet Place 1 application vaginally 2 (two) times a week.     . valsartan-hydrochlorothiazide (DIOVAN-HCT) 320-25 MG tablet Take 1 tablet by mouth daily. 90 tablet 3   No current facility-administered medications for this visit.     Objective: BP (!) 150/90   Pulse 76   Temp 98.3 F (36.8 C) (Oral)   Ht 5\' 1"  (1.549 m)   Wt 169 lb (76.7 kg)   BMI 31.93 kg/m  Gen: NAD, alert, cooperative with exam HEENT: NCAT, EOMI, PERRL CV: RRR, good S1/S2, no murmur Resp: CTABL, no wheezes, non-labored Abd: Soft, Mild RUQ tenderness, Non Distended, BS present, no guarding or organomegaly. Negative Murphy's. Ext: No edema, warm Neuro: Alert and oriented, No gross deficits   Assessment/Plan:  Encounter for preventive health examination Patient is here for her annual physical. She states that she feels overall very well but is a  little concerned about her recent right upper quadrant abdominal pain. Patient has been attempting to lose weight and had a 8 pound weight loss since our last visit. No other issues at this time. - Obtain labs: CBC, CMP, lipid panel, TSH. - Encourage continue physical activity and low-fat high-fiber diet. - Follow-up one year  Hypertension Appears worse. Patient endorses good compliance with her medications at this time. She is still in the hypertensive range after repeated  measurement. - Continue valsartan/hydrochlorothiazide - Initiate amlodipine 5 mg daily. - Follow-up for nurse's visit in one month  Hypothyroidism Stable: Patient currently on 50 g of Synthroid. She has no changes or concerns at this time. - Continue current regimen - Obtain TSH  RUQ pain Patient is complaining of acute onset right upper quadrant pain. Currently asymptomatic. No recent fevers or other red flag symptoms. Possible association with food. Etiology unknown at this time however I'm currently considering the possibility of cholecystitis.  - CMP - Holding on abdominal ultrasound for now as she has had minimal symptoms recently and asymptomatic right now. However, I've informed patient that if she experiences any symptoms like this again and she is to be seen in our office within 24 hours. I also informed her that I would like to have her go to the ED if she develops any fever or nausea/vomiting when experiencing any of this abdominal discomfort.   Orders Placed This Encounter  Procedures  . CBC  . TSH  . Lipid panel    Order Specific Question:   Has the patient fasted?    Answer:   Yes  . Comprehensive metabolic panel    Order Specific Question:   Has the patient fasted?    Answer:   Yes    Meds ordered this encounter  Medications  . valsartan-hydrochlorothiazide (DIOVAN-HCT) 320-25 MG tablet    Sig: Take 1 tablet by mouth daily.    Dispense:  90 tablet    Refill:  3    This prescription was filled on 12/02/2016. Any refills authorized will be placed on file.  . levothyroxine (SYNTHROID, LEVOTHROID) 50 MCG tablet    Sig: Take 1 tablet (50 mcg total) by mouth daily.    Dispense:  90 tablet    Refill:  3  . amLODipine (NORVASC) 5 MG tablet    Sig: Take 1 tablet (5 mg total) by mouth daily.    Dispense:  90 tablet    Refill:  3     Kathee Delton, MD,MS,  PGY3 03/14/2017 5:35 PM

## 2017-03-14 NOTE — Assessment & Plan Note (Signed)
Appears worse. Patient endorses good compliance with her medications at this time. She is still in the hypertensive range after repeated measurement. - Continue valsartan/hydrochlorothiazide - Initiate amlodipine 5 mg daily. - Follow-up for nurse's visit in one month

## 2017-03-14 NOTE — Patient Instructions (Signed)
It was a pleasure seeing you today in our clinic. Today we discussed your annual physical and blood pressure. Here is the treatment plan we have discussed and agreed upon together:   - I've refilled your pressure medication and thyroid medication. - I've sent in a prescription for a new medication called amlodipine. This should help reduce your blood pressure back to normal. Take 1 tablet once a day with your other daily medications. - The results of the labs we do today will be sent to you next week or the week after. - If you have any additional episodes of abdominal pain like you experience last month I would like to have you come in to be evaluated that day or the day after. If you develop any fevers or nausea and vomiting with these episodes of abdominal pain I think it would be wise to have you seen that day and not hesitate.  Health Maintenance, Female Adopting a healthy lifestyle and getting preventive care can go a long way to promote health and wellness. Talk with your health care provider about what schedule of regular examinations is right for you. This is a good chance for you to check in with your provider about disease prevention and staying healthy. In between checkups, there are plenty of things you can do on your own. Experts have done a lot of research about which lifestyle changes and preventive measures are most likely to keep you healthy. Ask your health care provider for more information. Weight and diet Eat a healthy diet  Be sure to include plenty of vegetables, fruits, low-fat dairy products, and lean protein.  Do not eat a lot of foods high in solid fats, added sugars, or salt.  Get regular exercise. This is one of the most important things you can do for your health.  Most adults should exercise for at least 150 minutes each week. The exercise should increase your heart rate and make you sweat (moderate-intensity exercise).  Most adults should also do strengthening  exercises at least twice a week. This is in addition to the moderate-intensity exercise. Maintain a healthy weight  Body mass index (BMI) is a measurement that can be used to identify possible weight problems. It estimates body fat based on height and weight. Your health care provider can help determine your BMI and help you achieve or maintain a healthy weight.  For females 13 years of age and older:  A BMI below 18.5 is considered underweight.  A BMI of 18.5 to 24.9 is normal.  A BMI of 25 to 29.9 is considered overweight.  A BMI of 30 and above is considered obese. Watch levels of cholesterol and blood lipids  You should start having your blood tested for lipids and cholesterol at 63 years of age, then have this test every 5 years.  You may need to have your cholesterol levels checked more often if:  Your lipid or cholesterol levels are high.  You are older than 63 years of age.  You are at high risk for heart disease. Cancer screening Lung Cancer  Lung cancer screening is recommended for adults 37-68 years old who are at high risk for lung cancer because of a history of smoking.  A yearly low-dose CT scan of the lungs is recommended for people who:  Currently smoke.  Have quit within the past 15 years.  Have at least a 30-pack-year history of smoking. A pack year is smoking an average of one pack of cigarettes a  day for 1 year.  Yearly screening should continue until it has been 15 years since you quit.  Yearly screening should stop if you develop a health problem that would prevent you from having lung cancer treatment. Breast Cancer  Practice breast self-awareness. This means understanding how your breasts normally appear and feel.  It also means doing regular breast self-exams. Let your health care provider know about any changes, no matter how small.  If you are in your 20s or 30s, you should have a clinical breast exam (CBE) by a health care provider every 1-3  years as part of a regular health exam.  If you are 12 or older, have a CBE every year. Also consider having a breast X-ray (mammogram) every year.  If you have a family history of breast cancer, talk to your health care provider about genetic screening.  If you are at high risk for breast cancer, talk to your health care provider about having an MRI and a mammogram every year.  Breast cancer gene (BRCA) assessment is recommended for women who have family members with BRCA-related cancers. BRCA-related cancers include:  Breast.  Ovarian.  Tubal.  Peritoneal cancers.  Results of the assessment will determine the need for genetic counseling and BRCA1 and BRCA2 testing. Cervical Cancer  Your health care provider may recommend that you be screened regularly for cancer of the pelvic organs (ovaries, uterus, and vagina). This screening involves a pelvic examination, including checking for microscopic changes to the surface of your cervix (Pap test). You may be encouraged to have this screening done every 3 years, beginning at age 76.  For women ages 65-65, health care providers may recommend pelvic exams and Pap testing every 3 years, or they may recommend the Pap and pelvic exam, combined with testing for human papilloma virus (HPV), every 5 years. Some types of HPV increase your risk of cervical cancer. Testing for HPV may also be done on women of any age with unclear Pap test results.  Other health care providers may not recommend any screening for nonpregnant women who are considered low risk for pelvic cancer and who do not have symptoms. Ask your health care provider if a screening pelvic exam is right for you.  If you have had past treatment for cervical cancer or a condition that could lead to cancer, you need Pap tests and screening for cancer for at least 20 years after your treatment. If Pap tests have been discontinued, your risk factors (such as having a new sexual partner) need to  be reassessed to determine if screening should resume. Some women have medical problems that increase the chance of getting cervical cancer. In these cases, your health care provider may recommend more frequent screening and Pap tests. Colorectal Cancer  This type of cancer can be detected and often prevented.  Routine colorectal cancer screening usually begins at 63 years of age and continues through 63 years of age.  Your health care provider may recommend screening at an earlier age if you have risk factors for colon cancer.  Your health care provider may also recommend using home test kits to check for hidden blood in the stool.  A small camera at the end of a tube can be used to examine your colon directly (sigmoidoscopy or colonoscopy). This is done to check for the earliest forms of colorectal cancer.  Routine screening usually begins at age 73.  Direct examination of the colon should be repeated every 5-10 years through 63  years of age. However, you may need to be screened more often if early forms of precancerous polyps or small growths are found. Skin Cancer  Check your skin from head to toe regularly.  Tell your health care provider about any new moles or changes in moles, especially if there is a change in a mole's shape or color.  Also tell your health care provider if you have a mole that is larger than the size of a pencil eraser.  Always use sunscreen. Apply sunscreen liberally and repeatedly throughout the day.  Protect yourself by wearing long sleeves, pants, a wide-brimmed hat, and sunglasses whenever you are outside. Heart disease, diabetes, and high blood pressure  High blood pressure causes heart disease and increases the risk of stroke. High blood pressure is more likely to develop in:  People who have blood pressure in the high end of the normal range (130-139/85-89 mm Hg).  People who are overweight or obese.  People who are African American.  If you are  84-27 years of age, have your blood pressure checked every 3-5 years. If you are 34 years of age or older, have your blood pressure checked every year. You should have your blood pressure measured twice-once when you are at a hospital or clinic, and once when you are not at a hospital or clinic. Record the average of the two measurements. To check your blood pressure when you are not at a hospital or clinic, you can use:  An automated blood pressure machine at a pharmacy.  A home blood pressure monitor.  If you are between 65 years and 82 years old, ask your health care provider if you should take aspirin to prevent strokes.  Have regular diabetes screenings. This involves taking a blood sample to check your fasting blood sugar level.  If you are at a normal weight and have a low risk for diabetes, have this test once every three years after 63 years of age.  If you are overweight and have a high risk for diabetes, consider being tested at a younger age or more often. Preventing infection Hepatitis B  If you have a higher risk for hepatitis B, you should be screened for this virus. You are considered at high risk for hepatitis B if:  You were born in a country where hepatitis B is common. Ask your health care provider which countries are considered high risk.  Your parents were born in a high-risk country, and you have not been immunized against hepatitis B (hepatitis B vaccine).  You have HIV or AIDS.  You use needles to inject street drugs.  You live with someone who has hepatitis B.  You have had sex with someone who has hepatitis B.  You get hemodialysis treatment.  You take certain medicines for conditions, including cancer, organ transplantation, and autoimmune conditions. Hepatitis C  Blood testing is recommended for:  Everyone born from [birth year] through 1967.  Anyone with known risk factors for hepatitis C. Sexually transmitted infections (STIs)  You should be screened  for sexually transmitted infections (STIs) including gonorrhea and chlamydia if:  You are sexually active and are younger than 63 years of age.  You are older than 63 years of age and your health care provider tells you that you are at risk for this type of infection.  Your sexual activity has changed since you were last screened and you are at an increased risk for chlamydia or gonorrhea. Ask your health care provider if you  are at risk.  If you do not have HIV, but are at risk, it may be recommended that you take a prescription medicine daily to prevent HIV infection. This is called pre-exposure prophylaxis (PrEP). You are considered at risk if:  You are sexually active and do not regularly use condoms or know the HIV status of your partner(s).  You take drugs by injection.  You are sexually active with a partner who has HIV. Talk with your health care provider about whether you are at high risk of being infected with HIV. If you choose to begin PrEP, you should first be tested for HIV. You should then be tested every 3 months for as long as you are taking PrEP. Pregnancy  If you are premenopausal and you may become pregnant, ask your health care provider about preconception counseling.  If you may become pregnant, take 400 to 800 micrograms (mcg) of folic acid every day.  If you want to prevent pregnancy, talk to your health care provider about birth control (contraception). Osteoporosis and menopause  Osteoporosis is a disease in which the bones lose minerals and strength with aging. This can result in serious bone fractures. Your risk for osteoporosis can be identified using a bone density scan.  If you are 64 years of age or older, or if you are at risk for osteoporosis and fractures, ask your health care provider if you should be screened.  Ask your health care provider whether you should take a calcium or vitamin D supplement to lower your risk for osteoporosis.  Menopause may  have certain physical symptoms and risks.  Hormone replacement therapy may reduce some of these symptoms and risks. Talk to your health care provider about whether hormone replacement therapy is right for you. Follow these instructions at home:  Schedule regular health, dental, and eye exams.  Stay current with your immunizations.  Do not use any tobacco products including cigarettes, chewing tobacco, or electronic cigarettes.  If you are pregnant, do not drink alcohol.  If you are breastfeeding, limit how much and how often you drink alcohol.  Limit alcohol intake to no more than 1 drink per day for nonpregnant women. One drink equals 12 ounces of beer, 5 ounces of wine, or 1 ounces of hard liquor.  Do not use street drugs.  Do not share needles.  Ask your health care provider for help if you need support or information about quitting drugs.  Tell your health care provider if you often feel depressed.  Tell your health care provider if you have ever been abused or do not feel safe at home. This information is not intended to replace advice given to you by your health care provider. Make sure you discuss any questions you have with your health care provider. Document Released: 06/24/2011 Document Revised: 05/16/2016 Document Reviewed: 09/12/2015 Elsevier Interactive Patient Education  2017 [employer].

## 2017-03-15 LAB — COMPREHENSIVE METABOLIC PANEL
ALBUMIN: 4.8 g/dL (ref 3.6–4.8)
ALT: 37 IU/L — ABNORMAL HIGH (ref 0–32)
AST: 23 IU/L (ref 0–40)
Albumin/Globulin Ratio: 1.8 (ref 1.2–2.2)
Alkaline Phosphatase: 161 IU/L — ABNORMAL HIGH (ref 39–117)
BILIRUBIN TOTAL: 0.5 mg/dL (ref 0.0–1.2)
BUN/Creatinine Ratio: 15 (ref 12–28)
BUN: 12 mg/dL (ref 8–27)
CHLORIDE: 100 mmol/L (ref 96–106)
CO2: 27 mmol/L (ref 18–29)
Calcium: 10.5 mg/dL — ABNORMAL HIGH (ref 8.7–10.3)
Creatinine, Ser: 0.79 mg/dL (ref 0.57–1.00)
GFR calc non Af Amer: 80 mL/min/{1.73_m2} (ref >59)
GFR, EST AFRICAN AMERICAN: 93 mL/min/{1.73_m2} (ref >59)
GLUCOSE: 95 mg/dL (ref 65–99)
Globulin, Total: 2.7 g/dL (ref 1.5–4.5)
Potassium: 4.4 mmol/L (ref 3.5–5.2)
Sodium: 144 mmol/L (ref 134–144)
TOTAL PROTEIN: 7.5 g/dL (ref 6.0–8.5)

## 2017-03-15 LAB — LIPID PANEL
Chol/HDL Ratio: 3.8 ratio units (ref 0.0–4.4)
Cholesterol, Total: 225 mg/dL — ABNORMAL HIGH (ref 100–199)
HDL: 60 mg/dL (ref >39)
LDL Calculated: 146 mg/dL — ABNORMAL HIGH (ref 0–99)
Triglycerides: 95 mg/dL (ref 0–149)
VLDL Cholesterol Cal: 19 mg/dL (ref 5–40)

## 2017-03-15 LAB — CBC
Hematocrit: 45.2 % (ref 34.0–46.6)
Hemoglobin: 14.9 g/dL (ref 11.1–15.9)
MCH: 30.5 pg (ref 26.6–33.0)
MCHC: 33 g/dL (ref 31.5–35.7)
MCV: 92 fL (ref 79–97)
PLATELETS: 308 10*3/uL (ref 150–379)
RBC: 4.89 x10E6/uL (ref 3.77–5.28)
RDW: 13.3 % (ref 12.3–15.4)
WBC: 5.9 10*3/uL (ref 3.4–10.8)

## 2017-03-15 LAB — TSH: TSH: 2.83 u[IU]/mL (ref 0.450–4.500)

## 2017-03-19 ENCOUNTER — Telehealth: Payer: Self-pay | Admitting: Family Medicine

## 2017-03-19 NOTE — Telephone Encounter (Signed)
Called patient to discuss lab results.  Patient's cholesterol was slightly elevated. All other labs were unremarkable/normal.   I feel as though it would be ideal to place patient on a daily statin but had wanted to discuss this with her before placing the order.  Statin would be a once a day medication that would help to lower her cholesterol. Typical side effect seen in some patients is muscle soreness, commonly in the quads, hamstrings, and buttock.  I left a voicemail to have her contact our office.  Kathee DeltonIan D McKeag, MD,MS,  PGY3 03/19/2017 6:58 PM

## 2017-03-20 ENCOUNTER — Telehealth: Payer: Self-pay | Admitting: *Deleted

## 2017-03-20 NOTE — Telephone Encounter (Signed)
Patient called our office back.  Informed of lab results for cholesterol and Dr. Wende MottMcKeag wanting to start her on statin.  Will route message to Dr. Wende MottMcKeag to call patient back to discuss starting statin in more detail.  Altamese Dilling~Flannery Cavallero, BSN, RN-BC

## 2017-03-25 ENCOUNTER — Other Ambulatory Visit: Payer: Self-pay | Admitting: Family Medicine

## 2017-03-25 MED ORDER — ATORVASTATIN CALCIUM 40 MG PO TABS: 40.0000 mg | ORAL_TABLET | Freq: Every day | ORAL | 3 refills | Status: DC

## 2017-03-25 NOTE — Telephone Encounter (Signed)
Attempted to call patient but got VM.  I'm trying to discuss patient's lipid panel from her most recent visit. I would like to start her on a daily statin, I believe she was informed of this when she called last week, but I've been unable to discuss the specifics with her.  I have called her in Lipitor 40 mg. This is a once a day medication. It can be taken with her other medications. Common side effects are large muscle group (hips, thighs, buttock) soreness, which she should stop taking this medication if she experiences.  Please call patient to let her know of this medication recommendation. If she would still like for me to contact her then please ask her for an ideal time for her to be reached.

## 2017-03-27 NOTE — Telephone Encounter (Signed)
Left another message for patient to return call.

## 2017-07-08 ENCOUNTER — Telehealth: Payer: Self-pay | Admitting: Internal Medicine

## 2017-07-08 NOTE — Telephone Encounter (Signed)
Pt states valsartan is recalled and needs something else called into [facility]. ep

## 2017-07-09 MED ORDER — LOSARTAN POTASSIUM 50 MG PO TABS: ORAL_TABLET | ORAL | 0 refills | Status: DC

## 2017-07-09 MED ORDER — HYDROCHLOROTHIAZIDE 25 MG PO TABS: 25.0000 mg | ORAL_TABLET | Freq: Every day | ORAL | 0 refills | Status: DC

## 2017-07-09 NOTE — Telephone Encounter (Signed)
Changed valsartan-hctz 320-25 mg to losartan 50 mg with instructions to increase to 100 mg if tolerated and BPs above 140/90. Prescribed hctz 25 mg separately. Please tell patient to call if tolerating new medication, and I can switch her to combination pill for next refill. Would need to know if she is taking 50 mg or 100 mg of losartan at that time.  Dani GobbleHillary Fitzgerald, MD [facility], PGY-3

## 2017-07-09 NOTE — Telephone Encounter (Signed)
Will forward to PCP.  Martin, Tamika L, RN  

## 2017-07-11 ENCOUNTER — Telehealth: Payer: Self-pay | Admitting: Internal Medicine

## 2017-07-11 MED ORDER — OLMESARTAN MEDOXOMIL-HCTZ 40-25 MG PO TABS: 1.0000 | ORAL_TABLET | Freq: Every day | ORAL | 2 refills | Status: DC

## 2017-07-11 MED ORDER — AMLODIPINE BESYLATE 5 MG PO TABS: 5.0000 mg | ORAL_TABLET | Freq: Every day | ORAL | 2 refills | Status: DC

## 2017-07-11 NOTE — Telephone Encounter (Signed)
Pt called because she needs her Losartan and Hydrochlorothiazide called into [facility] instead of out patient.SHe would also like for one of the doctors nurses to call about another medication that she received on her last visit and if she should continue with that since it was a medicine to go with the Valsartan. She didn't have the name of the medication with her when she called jw.

## 2017-07-11 NOTE — Telephone Encounter (Signed)
Called patient. She also wanted to know if she should continue amlodipine. I recommended she continue this. I switched her medication to benicar (olmesartan-hctz 40-25 mg), which should be equivalent to her diovan hct. [facility].

## 2018-01-21 ENCOUNTER — Ambulatory Visit (INDEPENDENT_AMBULATORY_CARE_PROVIDER_SITE_OTHER): Payer: Managed Care, Other (non HMO) | Admitting: Internal Medicine

## 2018-01-21 VITALS — BP 130/70 | HR 73 | Temp 98.4°F | Wt 173.6 lb

## 2018-01-21 DIAGNOSIS — Z23 Encounter for immunization: Secondary | ICD-10-CM | POA: Diagnosis not present

## 2018-01-21 DIAGNOSIS — M25562 Pain in left knee: Secondary | ICD-10-CM | POA: Diagnosis not present

## 2018-01-21 DIAGNOSIS — G8929 Other chronic pain: Secondary | ICD-10-CM

## 2018-01-21 MED ORDER — DICLOFENAC SODIUM 1 % TD GEL: 2.0000 g | Freq: Four times a day (QID) | TRANSDERMAL | 1 refills | Status: DC | PRN

## 2018-01-21 NOTE — Patient Instructions (Signed)
Ms. Huston FoleyBrady,  You have some soft tissue swelling at the medial side of your knee. You may have some underlying arthritis. I recommend doing leg strengthening exercises, trying stationary biking, and applying voltaren gel to help with inflammation. Ice and elevation may help as well. You could also try 1000 mg tylenol twice a day.   If you have no improvement in about 1 month. Please see me back or let me know if you'd like to be referred to sports medicine.  Best, Dr. Sampson GoonFitzgerald

## 2018-01-23 ENCOUNTER — Encounter: Payer: Self-pay | Admitting: Internal Medicine

## 2018-01-23 DIAGNOSIS — M25562 Pain in left knee: Principal | ICD-10-CM

## 2018-01-23 DIAGNOSIS — G8929 Other chronic pain: Secondary | ICD-10-CM | POA: Insufficient documentation

## 2018-01-23 NOTE — Progress Notes (Signed)
[facility] Progress Note  Subjective:  Pattricia BossDebra Quest is a 62 y.o. female with history of HTN, prediabetes, obesity and hypothyroidism who presents for left knee pain. Symptoms began about 4 months ago. No known injury or incident of twisting. No increased activity. Pain is worse with a lot of walking. Thinks she may also have some restless leg symptoms -- sometimes wakes up from pain. On occasion has sensation of left knee "popping." Has not noted swelling. Has not tried anything for the pain, including OTC antiinflammatories, aside from occasional use of knee sleeve. Denies knee pain in the past. Wonders if use of weight machines for legs when she was younger could be contributing. Would like to lose weight and be more active, so this is increasingly bothersome to her. Has a mostly sedentary job. ROS: No fevers or chills, no falls; thinks she has some associated intermittent left heel pain.  Allergies  Allergen Reactions  . Contrast Media [Iodinated Diagnostic Agents]     Itching and hives    Social History   Tobacco Use  . Smoking status: Never Smoker  . Smokeless tobacco: Never Used  Substance Use Topics  . Alcohol use: No    Objective: Blood pressure 130/70, pulse 73, temperature 98.4 F (36.9 C), temperature source Oral, weight 173 lb 9.6 oz (78.7 kg), SpO2 95 %. Body mass index is 32.8 kg/m. Constitutional: Obese female, pleasant, in NAD Musculoskeletal: Minimal soft tissue swelling at medial aspect of left knee compared to right without effusion. No overlying erythema. TTP at lateral joint line of right knee and at medial joint line of left knee. Mild bilateral crepitus. No pain with McMurray's or Thessaly's. Stable knees with anterior and posterior drawer testing. Feet with normal arches. No heel tenderness.  Neurological: AOx3, no focal deficits. Skin: Skin is warm and dry. No rash noted.  Psychiatric: Normal mood and affect.  Vitals  reviewed  Assessment/Plan: Chronic pain of left knee - Suspect OA given presence of crepitus, mild soft tissue swelling, and obesity. No inciting injury and negative Thessaly's, suggesting against meniscal injury.  - Recommended anti-inflammatories, elevating, icing and using pain as guide for activity. Can continue compression sleeve if helps remind patient to limit activity if having pain. - Patient prefers trying topical anti-inflammatory, as does not feel PO tylenol would help. Ordered voltaren gel. - Recommended engaging in less weight-bearing activity like riding stationary bike for cardio. Provided handout on quad-strengthening exercises. - Will defer knee x-ray at this time due to patient preference and unlikely to change management with stable knee  Follow-up in about 1 month if no improvement. Could consider knee x-ray or Sports Med referral at that time.  Informed patient she is due for pap and mammogram, but she prefers to have these completed through her gynecologist's office.   Dani GobbleHillary Brentney Goldbach, MD [facility], PGY-3

## 2018-01-23 NOTE — Assessment & Plan Note (Signed)
-   Suspect OA given presence of crepitus, mild soft tissue swelling, and obesity. No inciting injury and negative Thessaly's, suggesting against meniscal injury.  - Recommended anti-inflammatories, elevating, icing and using pain as guide for activity. Can continue compression sleeve if helps remind patient to limit activity if having pain. - Patient prefers trying topical anti-inflammatory, as does not feel PO tylenol would help. Ordered voltaren gel. - Recommended engaging in less weight-bearing activity like riding stationary bike for cardio. Provided handout on quad-strengthening exercises. - Will defer knee x-ray at this time due to patient preference and unlikely to change management with stable knee

## 2018-02-02 ENCOUNTER — Other Ambulatory Visit: Payer: Self-pay | Admitting: Family Medicine

## 2018-03-04 ENCOUNTER — Other Ambulatory Visit: Payer: Self-pay | Admitting: Internal Medicine

## 2018-03-04 ENCOUNTER — Telehealth: Payer: Self-pay | Admitting: *Deleted

## 2018-03-04 MED ORDER — MELOXICAM 15 MG PO TABS: 15.0000 mg | ORAL_TABLET | Freq: Every day | ORAL | 0 refills | Status: DC

## 2018-03-04 NOTE — Telephone Encounter (Signed)
Seen about month ago for knee pain.  Given Rx for cream and it hasn't helped.  Also, using ibuprofen and it seems to help some, but didn't want to stay on it long-term and it doesn't completely clear up the inflammation.  Pain does wake her up at night.  Will be going on vacation and doing a lot of walking.  Wants to know if PCP can Rx something stronger for pain/inflammation.  Main concern is she wants to get inflammation under control and resolved.  Offered an appt for re-evaluation, but patient declined since she was recently seen for same issue.  Will route request to PCP for advice and call patient back.  Altamese Dilling~Jeannette Richardson, BSN, RN-BC

## 2018-03-04 NOTE — Telephone Encounter (Signed)
Called patient. Discussed options including stronger short course of NSAID, 2 extra strength tylenol twice a day, or considering knee steroid injection. Recommended knee xray prior to considering injection. Patient leaving Friday and would like to try stronger NSAID. Ordered meloxicam. Patient to call if she would like to go forward with knee xray.  Dani GobbleHillary Leiliana Foody, MD [facility], PGY-3

## 2018-03-04 NOTE — Telephone Encounter (Signed)
Called patient and sent in mobic.

## 2018-03-09 ENCOUNTER — Other Ambulatory Visit: Payer: Self-pay | Admitting: Internal Medicine

## 2018-04-03 ENCOUNTER — Other Ambulatory Visit: Payer: Self-pay | Admitting: Family Medicine

## 2018-04-08 ENCOUNTER — Other Ambulatory Visit: Payer: Self-pay | Admitting: Internal Medicine

## 2018-04-13 ENCOUNTER — Other Ambulatory Visit: Payer: Self-pay

## 2018-04-13 MED ORDER — ATORVASTATIN CALCIUM 40 MG PO TABS: 40.0000 mg | ORAL_TABLET | Freq: Every day | ORAL | 0 refills | Status: DC

## 2018-04-16 ENCOUNTER — Telehealth: Payer: Self-pay

## 2018-04-16 MED ORDER — ATORVASTATIN CALCIUM 40 MG PO TABS: 40.0000 mg | ORAL_TABLET | Freq: Every day | ORAL | 0 refills | Status: DC

## 2018-04-16 NOTE — Telephone Encounter (Signed)
[facility] calling for [facility]ill of [facility]atin. [facility]. Shawna OrleansMeredith B Lorri Fukuhara, RN

## 2018-05-21 ENCOUNTER — Telehealth: Payer: Self-pay

## 2018-05-21 NOTE — Telephone Encounter (Signed)
SENT REFERRAL TO SCHEDULING 

## 2018-05-22 NOTE — Telephone Encounter (Signed)
This encounter was created in error - please disregard.

## 2018-05-22 NOTE — Addendum Note (Signed)
Addended by: Yolonda KidaJACOBS, Vianney Kopecky M on: 05/22/2018 09:33 AM   Modules accepted: Level of Service, SmartSet

## 2018-06-22 ENCOUNTER — Telehealth: Payer: Self-pay | Admitting: *Deleted

## 2018-06-22 NOTE — Telephone Encounter (Signed)
REFERRAL SENT TO SCHEDULING FROM DR. ROBERT COLLINS [phone].

## 2018-06-30 ENCOUNTER — Other Ambulatory Visit: Payer: Self-pay | Admitting: Internal Medicine

## 2018-08-10 ENCOUNTER — Other Ambulatory Visit: Payer: Self-pay | Admitting: Internal Medicine

## 2018-09-09 ENCOUNTER — Other Ambulatory Visit: Payer: Self-pay | Admitting: Internal Medicine

## 2018-09-14 ENCOUNTER — Other Ambulatory Visit: Payer: Self-pay | Admitting: Family Medicine

## 2018-11-12 ENCOUNTER — Other Ambulatory Visit: Payer: Self-pay | Admitting: Family Medicine

## 2019-01-23 ENCOUNTER — Other Ambulatory Visit: Payer: Self-pay | Admitting: Family Medicine

## 2019-03-23 ENCOUNTER — Other Ambulatory Visit: Payer: Self-pay | Admitting: Family Medicine

## 2019-04-16 ENCOUNTER — Other Ambulatory Visit: Payer: Self-pay | Admitting: Family Medicine

## 2019-04-16 ENCOUNTER — Other Ambulatory Visit: Payer: Self-pay | Admitting: Internal Medicine

## 2019-05-28 NOTE — Progress Notes (Signed)
Subjective:    Patient ID: Sara Ward, female    DOB: 12/15/1956, 63 y.o.   MRN: [id number]   CC: knee pain, hypothyroidism, hypertension, hyperlipidemia   HPI: LE pain Patient reports new lower extremity pain in legs and feet.  States that in May 2019 she had a repair of her left meniscus ever since she has had aching in both her legs.  It is worse in the morning but is also better in the evening.  Has had to get injections in the foot by her podiatrist in the past which helped somewhat but not constant.  Note some swelling in her joints as well.  Left foot has pain in the bottom but not right foot does not.  Pain is in her knees her legs in her feet.  Mother has a history of rheumatoid arthritis.  No personal history of rheumatoid arthritis.  Notices some joint swelling in her hands as well as she try to put her wedding ring on today and felt more tight. Seen by Dr. Sampson GoonFitzgerald on 12/2017 for evaluation of knee pain. At that time thought to be 2/2 OA. Recommended to use voltaren gell as well as quad strengthening exercises. Told to follow up in 1 month if no improvement, at which time xray and sports med referral could be made.   Hypothyroidism Currently taking synthroid 50mcg daily. No problems with this. Has been on this dose for years. Not sure why she was on it. Denies goiter or neck swelling. No hair loss. No increased fatigue, no palpitations, reports some weight gain but thinks it is because she can't go to gym 2/2 COVID.    Hypertension: - Medications: Amlodipine 5 mg, olmesartan/hydrochlorothiazide, aspirin 81 mg - Compliance: good - Checking BP at home: no - Denies any SOB, CP, vision changes, LE edema, medication SEs, or symptoms of hypotension - Diet: is working on diet but hard with COVID. Husband is vegan but she is tried of that stuff. Eats some meat  - Exercise: used to exercise daily but has had to decrease since gyms are closed due to COVID. Unable to walk outside since it  hurts her husbands hips.   Hyperlipidemia Meds:Atorvastatin 40 mg, aspirin 81 mg Diet: see above  Exercise: see above    Objective:  BP 130/72   Pulse 89   SpO2 98%  Vitals and nursing note reviewed  General: well nourished, in no acute distress HEENT: normocephalic, no scleral icterus or conjunctival pallor, no nasal discharge Neck: supple Cardiac: RRR, clear S1 and S2, no murmurs, rubs, or gallops Respiratory: clear to auscultation bilaterally, no increased work of breathing Abdomen: soft, nontender, nondistended, no masses or organomegaly. Bowel sounds present Extremities: no edema or cyanosis. Warm, well perfused. 2+ radial pulses bilaterally. Some joint swelling in fingers but minimal. No knee edema or ankle edema. Normal knee exam. Negative McMurry's  Skin: warm and dry, no rashes noted Neuro: alert and oriented, no focal deficits   Assessment & Plan:    Joint pain Patient reports lower extremity joint pain in her knees ankles and feet.  Does have some hand joint swelling as well.  Family history of rheumatoid arthritis.  Story and physical exam seem more consistent with osteoarthritis.  However given his family history and concerned that joint pain is worse in the morning will obtain rheumatoid factor as well as CRP to rule this out.  Patient can use NSAIDs for pain relief.  Follow-up if no improvement.  Strict return precautions given.  Hypothyroidism Currently on 50 mcg of Synthroid.  No changes or concerns at this time.  Does report some fatigue but this may really related to less activity and being home more.  Will obtain TSH and T4.  Continue current regimen.  Hypertension Well-controlled.  Reports good compliance with her medications.  Continue current regimen of amlodipine, olmesartan/hydrochlorothiazide. Will obtain BMP. Follow-up in 3 months.  Hyperlipidemia Currently using atorvastatin 40 mg.  Continue.  Will obtain lipid panel.    Return in about 3 months  (around 08/31/2019).   Oralia Manis, DO, PGY-2

## 2019-05-31 ENCOUNTER — Encounter: Payer: Self-pay | Admitting: Family Medicine

## 2019-05-31 ENCOUNTER — Other Ambulatory Visit: Payer: Self-pay

## 2019-05-31 ENCOUNTER — Ambulatory Visit (INDEPENDENT_AMBULATORY_CARE_PROVIDER_SITE_OTHER): Payer: Managed Care, Other (non HMO) | Admitting: Family Medicine

## 2019-05-31 VITALS — BP 130/72 | HR 89

## 2019-05-31 DIAGNOSIS — E7849 Other hyperlipidemia: Secondary | ICD-10-CM | POA: Diagnosis not present

## 2019-05-31 DIAGNOSIS — I10 Essential (primary) hypertension: Secondary | ICD-10-CM

## 2019-05-31 DIAGNOSIS — E785 Hyperlipidemia, unspecified: Secondary | ICD-10-CM

## 2019-05-31 DIAGNOSIS — E038 Other specified hypothyroidism: Secondary | ICD-10-CM | POA: Diagnosis not present

## 2019-05-31 DIAGNOSIS — M25561 Pain in right knee: Secondary | ICD-10-CM

## 2019-05-31 DIAGNOSIS — E1169 Type 2 diabetes mellitus with other specified complication: Secondary | ICD-10-CM

## 2019-05-31 DIAGNOSIS — M25562 Pain in left knee: Secondary | ICD-10-CM

## 2019-05-31 DIAGNOSIS — M2550 Pain in unspecified joint: Secondary | ICD-10-CM | POA: Insufficient documentation

## 2019-05-31 NOTE — Assessment & Plan Note (Signed)
Patient reports lower extremity joint pain in her knees ankles and feet.  Does have some hand joint swelling as well.  Family history of rheumatoid arthritis.  Story and physical exam seem more consistent with osteoarthritis.  However given his family history and concerned that joint pain is worse in the morning will obtain rheumatoid factor as well as CRP to rule this out.  Patient can use NSAIDs for pain relief.  Follow-up if no improvement.  Strict return precautions given.

## 2019-05-31 NOTE — Assessment & Plan Note (Addendum)
Well-controlled.  Reports good compliance with her medications.  Continue current regimen of amlodipine, olmesartan/hydrochlorothiazide. Will obtain BMP. Follow-up in 3 months.

## 2019-05-31 NOTE — Assessment & Plan Note (Signed)
Currently on 50 mcg of Synthroid.  No changes or concerns at this time.  Does report some fatigue but this may really related to less activity and being home more.  Will obtain TSH and T4.  Continue current regimen.

## 2019-05-31 NOTE — Assessment & Plan Note (Signed)
Currently using atorvastatin 40 mg.  Continue.  Will obtain lipid panel.

## 2019-05-31 NOTE — Patient Instructions (Signed)
It was a pleasure seeing you today.   Today we discussed your joint pain, hypertension, hyperlipidemia, and hypothyroidism  I have ordered labs and will call with results.   Please follow up in 3 months or sooner if symptoms persist or worsen. Please call the clinic immediately if you have any concerns.   Our clinic's number is [phone]. Please call with questions or concerns.    Thank you,  Caroline More, DO

## 2019-06-01 LAB — BASIC METABOLIC PANEL
BUN/Creatinine Ratio: 18 (ref 12–28)
BUN: 17 mg/dL (ref 8–27)
CO2: 24 mmol/L (ref 20–29)
Calcium: 10 mg/dL (ref 8.7–10.3)
Chloride: 102 mmol/L (ref 96–106)
Creatinine, Ser: 0.96 mg/dL (ref 0.57–1.00)
GFR calc Af Amer: 72 mL/min/{1.73_m2} (ref >59)
GFR calc non Af Amer: 63 mL/min/{1.73_m2} (ref >59)
Glucose: 107 mg/dL — ABNORMAL HIGH (ref 65–99)
Potassium: 4.2 mmol/L (ref 3.5–5.2)
Sodium: 141 mmol/L (ref 134–144)

## 2019-06-01 LAB — LIPID PANEL
Chol/HDL Ratio: 2.4 ratio (ref 0.0–4.4)
Cholesterol, Total: 120 mg/dL (ref 100–199)
HDL: 51 mg/dL (ref >39)
LDL Calculated: 43 mg/dL (ref 0–99)
Triglycerides: 132 mg/dL (ref 0–149)
VLDL Cholesterol Cal: 26 mg/dL (ref 5–40)

## 2019-06-01 LAB — TSH+FREE T4
Free T4: 1.06 ng/dL (ref 0.82–1.77)
TSH: 2.8 u[IU]/mL (ref 0.450–4.500)

## 2019-06-01 LAB — RHEUMATOID FACTOR: Rheumatoid fact SerPl-aCnc: <10 IU/mL (ref 0.0–13.9)

## 2019-06-01 LAB — C-REACTIVE PROTEIN: CRP: 2 mg/L (ref 0–10)

## 2019-06-07 ENCOUNTER — Telehealth: Payer: Self-pay | Admitting: *Deleted

## 2019-06-07 NOTE — Telephone Encounter (Signed)
Pt lm on RN line. She has "more questions about her swelling"  LMOVM to call back. Christen Bame, CMA

## 2019-06-07 NOTE — Telephone Encounter (Signed)
Attempted to call back but no response. Left VM to call clinic. If patient has several questions it would probably be best to schedule for telemed visit.   Dalphine Handing, PGY-2 [facility] 06/07/2019 4:47 PM

## 2019-07-21 ENCOUNTER — Other Ambulatory Visit: Payer: Self-pay | Admitting: Family Medicine

## 2019-08-18 ENCOUNTER — Other Ambulatory Visit: Payer: Self-pay | Admitting: Family Medicine

## 2019-09-13 ENCOUNTER — Other Ambulatory Visit: Payer: Self-pay | Admitting: Family Medicine

## 2019-09-13 DIAGNOSIS — Z1231 Encounter for screening mammogram for malignant neoplasm of breast: Secondary | ICD-10-CM

## 2019-09-14 ENCOUNTER — Telehealth (INDEPENDENT_AMBULATORY_CARE_PROVIDER_SITE_OTHER): Payer: Managed Care, Other (non HMO) | Admitting: Family Medicine

## 2019-09-14 ENCOUNTER — Other Ambulatory Visit: Payer: Self-pay

## 2019-09-14 DIAGNOSIS — M25561 Pain in right knee: Secondary | ICD-10-CM

## 2019-09-14 DIAGNOSIS — G8929 Other chronic pain: Secondary | ICD-10-CM

## 2019-09-14 DIAGNOSIS — Z1211 Encounter for screening for malignant neoplasm of colon: Secondary | ICD-10-CM | POA: Diagnosis not present

## 2019-09-14 DIAGNOSIS — M25562 Pain in left knee: Secondary | ICD-10-CM

## 2019-09-14 NOTE — Progress Notes (Signed)
[place] Telemedicine Visit I connected with  Sara Ward on 09/15/19 by a video enabled telemedicine application and verified that I am speaking with the correct person using two identifiers.   I discussed the limitations of evaluation and management by telemedicine. The patient expressed understanding and agreed to proceed.   Patient consented to have virtual visit. Method of visit: Telephone  Encounter participants: Patient: Sara Ward - located at Home Provider: Caroline More - located at [facility] Others (if applicable): None  Chief Complaint: Knee pain  HPI: Knee pain Patient reports continued joint pain.  Did see me on 05/31/2019 for joint pain.  Has also been followed by her previous PCP Dr. Ola Spurr for evaluation of knee pain.  Knee pain was felt to be secondary to osteoarthritis.  At that time labs were obtained to rule out rheumatoid arthritis which all came back negative.  Patient was advised to use NSAIDs for pain relief and follow-up if no improvement.  Patient reports that although she has tried over-the-counter medications she does not seem to have significant relief.  Did report that when she had foot pain this improved with a steroid shot by her podiatrist.   ROS: per HPI  Pertinent PMHx: joint pain, chronic pain of left knee   Exam:  Respiratory: Speaking full sentences, no increased work of breathing  Assessment/Plan:  Joint pain Patient with continued joint pain likely secondary to osteoarthritis given the rheumatoid arthritis work-up was negative.  I discussed possible x-rays but given that patient has joint pain throughout multiple joints she does not want to undergo this at this time.  Advised conservative measures.  Will refer to sports medicine as patient seems to have benefit from steroid shots in the past.  Patient advised to follow-up with sports medicine and they will call her to make this appointment.  If patient has continued pain  can consider imaging of the joint to see what joint space looks like.  Continue daily exercise.  Ibuprofen for pain relief.  Follow-up if no improvement.   Healthcare maintenance Patient will schedule an appointment for her annual physical to update healthcare maintenance such as Pap smear.  Time spent during visit with patient: 15 minutes

## 2019-09-15 NOTE — Assessment & Plan Note (Signed)
Patient with continued joint pain likely secondary to osteoarthritis given the rheumatoid arthritis work-up was negative.  I discussed possible x-rays but given that patient has joint pain throughout multiple joints she does not want to undergo this at this time.  Advised conservative measures.  Will refer to sports medicine as patient seems to have benefit from steroid shots in the past.  Patient advised to follow-up with sports medicine and they will call her to make this appointment.  If patient has continued pain can consider imaging of the joint to see what joint space looks like.  Continue daily exercise.  Ibuprofen for pain relief.  Follow-up if no improvement.

## 2019-09-20 ENCOUNTER — Other Ambulatory Visit: Payer: Self-pay

## 2019-09-20 ENCOUNTER — Ambulatory Visit (INDEPENDENT_AMBULATORY_CARE_PROVIDER_SITE_OTHER): Payer: Managed Care, Other (non HMO) | Admitting: Family Medicine

## 2019-09-20 ENCOUNTER — Ambulatory Visit
Admission: RE | Admit: 2019-09-20 | Discharge: 2019-09-20 | Disposition: A | Discharge status: Home / Self Care | Payer: Managed Care, Other (non HMO) | Source: Ambulatory Visit | Attending: Family Medicine | Admitting: Family Medicine

## 2019-09-20 VITALS — BP 110/78 | Ht 60.0 in | Wt 175.0 lb

## 2019-09-20 DIAGNOSIS — M25562 Pain in left knee: Secondary | ICD-10-CM

## 2019-09-20 DIAGNOSIS — M25561 Pain in right knee: Secondary | ICD-10-CM

## 2019-09-20 DIAGNOSIS — G8929 Other chronic pain: Secondary | ICD-10-CM | POA: Diagnosis not present

## 2019-09-20 NOTE — Progress Notes (Signed)
Subjective:    Patient ID: Sara Ward, female    DOB: 04-08-1956, 63 y.o.   MRN: [id number]  HPI 63 year old female who presents with bilateral leg pain.  The pain involves her knees, ankles, and feet on both lower extremities.  One leg does not affect her more than the other per her report.  She states that they both started bothering her around 2 years ago.  She states that her joints feel "very stiff" in the morning and when she is sitting for long period of time.  She has to "walk it out".  She has been taking Tylenol which does help her pain but does upset her stomach.  She states that occasionally she will have a sharp pain in both medial and lateral aspects of the knee when she has exerted herself a lot.  She states at those times her pain is a 4 or 5 out of 10.  No skin changes.  States lower extremities feel swollen.  Of note the patient had a meniscus tear in a couple places left knee.  She underwent repair for this back in May 2019.  She states that she did rehab quite well and her knee has not been bothering her in this regard since then.  She was told by the surgeon that she "had less arthritis than he expected".  Review of Systems Per HPI  Past medical history, past surgical history, allergies, social history, medications reviewed    Objective:   Physical Exam General: Well-appearing, 63 year old Caucasian female Cardiac: Skin warm and dry Respiratory: No sensory muscle use  Right leg Right knee Inspection: Very mild nonpitting edema, most notable suprapatellar area. Palpation: Mild tenderness to joint line, medial and lateral.  No pitting edema noted, mild subcutaneous swelling appreciated Strength: 5/5 strength to flexion extension Range of motion: Fully intact to flexion extension Neurovascular: Skin warm and dry, sensation intact all distributions Special test: Negative anterior drawer, posterior drawer, McMurray, Thessaly  Right ankle Inspection: Very mild  nonpitting edema, most notable around ankle Palpation: No tenderness to palpation Range of motion: Fully intact to plantar/dorsiflexion.  Full intact to both inversion and eversion. Strength: 5 out of 5 strength to plantar and dorsiflexion.  Inversion and eversion Neurovascular: Skin warm and dry, palpable PT/DP. Special test: Negative talar tilt, negative anterior drawer  Left leg Left knee Inspection: Very mild nonpitting edema, most notable suprapatellar area. Palpation: Mild tenderness to joint line, medial and lateral.  No pitting edema noted, mild subcutaneous swelling appreciated Strength: 5/5 strength to flexion extension Range of motion: Fully intact to flexion extension Neurovascular: Skin warm and dry, sensation intact all distributions Special test: Negative anterior drawer, posterior drawer, McMurray, Thessaly  Left ankle Inspection: Very mild nonpitting edema, most notable around ankles Palpation: No tenderness to palpation Range of motion: Fully intact to plantar/dorsiflexion.  Full intact to both inversion and eversion. Strength: 5 out of 5 strength to plantar and dorsiflexion.  Inversion and eversion Neurovascular: Skin warm and dry, palpable PT/DP. Special test: Negative talar tilt, negative anterior drawer  Limited bilateral knee ultrasound: No signs of joint effusion as seen from suprapatellar pouch knee.  Small osteophytes noted medial knee bilaterally.    Assessment & Plan:  Assessment 63 year old female who presents with bilateral knee pain.  Patient with tenderness to palpation at joint line medially and laterally bilateral knees.  Left greater than right.  She does have some mild bone spurring noted on ultrasound.  Given her recent meniscal surgery there may  be some aspect of osteoarthritis contributing to her pain.  Will get bilateral standing knee x-rays to further evaluate.  Can take Aleve, Tylenol for the pain.  Gave patient quadricep strengthening exercises  to help with knee stability.  Can discuss follow-up pending x-ray results.  Bilateral knee pain -Bilateral knee x-rays AP/sunrise/standing -Home quadricep strengthening -Tylenol 500 mg 1-2 tabs 3 times per day -Aleve 1 to 2 tablets twice per day with food -Follow-up pending x-ray results  Bilateral ankle pain -Likely secondary to compensation from knee pain -Tylenol 500 mg 1 tablet 3 times per day -Aleve 1 tablet twice per day with food- -follow-up as needed for this issue  Myrene Buddy MD PGY-3 Family Medicine Resident

## 2019-09-20 NOTE — Patient Instructions (Signed)
Your pain is likely due to arthritis. Get x-rays after you leave today. These are the different medications you can take for this: Tylenol 500mg  1-2 tabs three times a day for pain. Capsaicin, aspercreme, or biofreeze topically up to four times a day may also help with pain. Some supplements that may help for arthritis: Boswellia extract, curcumin, pycnogenol Aleve 1-2 tabs twice a day with food Cortisone injections are an option. If cortisone injections do not help, there are different types of shots that may help but they take longer to take effect. It's important that you continue to stay active. Straight leg raises, knee extensions 3 sets of 10 once a day (add ankle weight if these become too easy). Consider physical therapy to strengthen muscles around the joint that hurts to take pressure off of the joint itself. Shoe inserts with good arch support may be helpful. Heat or ice 15 minutes at a time 3-4 times a day as needed to help with pain. Water aerobics and cycling with low resistance are the best two types of exercise for arthritis though any exercise is ok as long as it doesn't worsen the pain. Follow up will depend on the x-ray results.

## 2019-09-21 ENCOUNTER — Encounter: Payer: Self-pay | Admitting: Family Medicine

## 2019-09-24 ENCOUNTER — Encounter: Payer: Self-pay | Admitting: Sports Medicine

## 2019-09-24 ENCOUNTER — Ambulatory Visit (INDEPENDENT_AMBULATORY_CARE_PROVIDER_SITE_OTHER): Payer: Managed Care, Other (non HMO) | Admitting: Sports Medicine

## 2019-09-24 ENCOUNTER — Other Ambulatory Visit: Payer: Self-pay

## 2019-09-24 VITALS — BP 126/84 | Ht 60.0 in | Wt 175.0 lb

## 2019-09-24 DIAGNOSIS — M170 Bilateral primary osteoarthritis of knee: Secondary | ICD-10-CM

## 2019-09-24 MED ORDER — METHYLPREDNISOLONE ACETATE 40 MG/ML IJ SUSP
40.0000 mg | Freq: Once | INTRAMUSCULAR | Status: AC
  Administered 2019-09-24: 40 mg via INTRA_ARTICULAR

## 2019-09-24 MED ORDER — METHYLPREDNISOLONE ACETATE 40 MG/ML IJ SUSP
40.0000 mg | Freq: Once | INTRAMUSCULAR | Status: AC
  Administered 2019-09-24: 10:00:00 40 mg via INTRA_ARTICULAR

## 2019-09-24 NOTE — Progress Notes (Addendum)
PCP: Oralia Manis, DO  Subjective:   HPI: Patient is a 63 y.o. female here for bilateral knee injections.  Patient was last seen by Dr. Pearletha Forge 1 week ago.  At that point she is having bilateral knee pain that was consistent with osteoarthritis.  She had bilateral knee x-ray showing mild to moderate osteoarthritis of both of her knees.  She returns today to have steroid injections in her knees.  Patient notes walking as well as steps reproduce her pain.  The pain is located on the anterior aspect of her knee as well as the medial aspect of her knee.  She has no numbness or tingling.  She denies any locking or catching symptoms today.  She has been taking Tylenol and ibuprofen for pain.   Review of Systems: See HPI above.  Past Medical History:  Diagnosis Date  . Complication of anesthesia    hard to wake up  . Hypertension   . Hypothyroidism     Current Outpatient Medications on File Prior to Visit  Medication Sig Dispense Refill  . amLODipine (NORVASC) 5 MG tablet TAKE 1 TABLET BY MOUTH EVERY DAY 90 tablet 3  . aspirin 81 MG tablet Take 1 tablet (81 mg total) by mouth daily. 30 tablet   . atorvastatin (LIPITOR) 40 MG tablet TAKE 1 TABLET BY MOUTH EVERY DAY 90 tablet 0  . levothyroxine (SYNTHROID, LEVOTHROID) 50 MCG tablet TAKE 1 TABLET BY MOUTH daily 90 tablet 1  . olmesartan-hydrochlorothiazide (BENICAR HCT) 40-25 MG tablet TAKE 1 TABLET BY MOUTH EVERY DAY 90 tablet 2  . VAGIFEM 10 MCG TABS vaginal tablet Place 1 application vaginally 2 (two) times a week.      No current facility-administered medications on file prior to visit.     Past Surgical History:  Procedure Laterality Date  . APPENDECTOMY  2014  . LAPAROSCOPIC APPENDECTOMY N/A 11/30/2013   Procedure: APPENDECTOMY LAPAROSCOPIC;  Surgeon: Adolph Pollack, MD;  Location: [facility];  Service: General;  Laterality: N/A;  . TUBAL LIGATION      Allergies  Allergen Reactions  . Contrast Media [Iodinated Diagnostic Agents]      Itching and hives    Social History   Socioeconomic History  . Marital status: Married    Spouse name: Not on file  . Number of children: Not on file  . Years of education: Not on file  . Highest education level: Not on file  Occupational History  . Not on file  Social Needs  . Financial resource strain: Not on file  . Food insecurity    Worry: Not on file    Inability: Not on file  . Transportation needs    Medical: Not on file    Non-medical: Not on file  Tobacco Use  . Smoking status: Never Smoker  . Smokeless tobacco: Never Used  Substance and Sexual Activity  . Alcohol use: No  . Drug use: No  . Sexual activity: Never  Lifestyle  . Physical activity    Days per week: Not on file    Minutes per session: Not on file  . Stress: Not on file  Relationships  . Social Musician on phone: Not on file    Gets together: Not on file    Attends religious service: Not on file    Active member of club or organization: Not on file    Attends meetings of clubs or organizations: Not on file    Relationship status: Not on  file  . Intimate partner violence    Fear of current or ex partner: Not on file    Emotionally abused: Not on file    Physically abused: Not on file    Forced sexual activity: Not on file  Other Topics Concern  . Not on file  Social History Narrative  . Not on file    Family History  Problem Relation Age of Onset  . Hypertension Mother   . Hypertension Father   . Emphysema Father   . Heart attack Brother         Objective:  Physical Exam: BP 126/84   Ht 5' (1.524 m)   Wt 175 lb (79.4 kg)   BMI 34.18 kg/m  Gen: NAD, comfortable in exam room Lungs: Breathing comfortably on room air Knee Exam Bilateral -Inspection: no deformity, no discoloration -Palpation: Tenderness along medial joint line -ROM: Extension: 0 degrees; Flexion: 130 degrees -Strength: Extension: 5/5; Flexion: 5/5  -Limbs neurovascularly intact, no  instability noted    Assessment & Plan:  Patient is a 63 y.o. female here for bilateral knee injections for bilateral knee osteoarthritis  1.  Bilateral knee osteoarthritis -Consent was obtained for corticosteroid injection of her bilateral knees.  The skin was cleaned and prepped using alcohol swabs.  80 mg of Depo-Medrol and 1 cc of lidocaine were injected using sterile technique.  Patient tolerated the injection well there were no complications.  The same procedure was done on the contralateral knee. - Patient may ice the knee tonight as needed for discomfort from the injection -Patient may continue Tylenol and NSAIDs as needed for pain  Patient follow-up on an as-needed basis.  She was instructed to call clinic if she is not getting any improvement in the next 2 weeks  I was the preceptor for this visit and available for immediate consultation Shellia Cleverly, DO

## 2019-10-08 NOTE — Progress Notes (Signed)
Subjective:   Sara Ward is a 63 y.o. female with a history of HTN, hypothyroidism, HLD, obesity here for an annual gynecological exam.   Gyn concerns/Preventative healthcare  Last menstrual period: No LMP recorded. Patient is postmenopausal.  Regular periods: menopause in 2000  Heavy bleeding: no  Sexually active: yes  Contraception or hormonal therapy: post menopausal   Hx of STD: Patient does not desire STD screening  Dyspareunia: Yes, but 2/2 poor lubrication after menopause. Patient reports she does not want treatment at this time but will try OTC lubrication if desired   Hot flashes: No  Vaginal discharge: none  Dysuria:No   Last mammogram: due, has appointment on 11/6  Breast mass or concerns: No  Last pap smear: 2014, no h/o abnormal paps   The ASCVD Risk score Denman George DC Jr., et al., 2013) failed to calculate for the following reasons:   The valid total cholesterol range is 130 to 320 mg/dL  PMH, Surgical Hx, Family Hx, Social History reviewed and updated as below.  Past Medical History:  Diagnosis Date  . Complication of anesthesia    hard to wake up  . Hypertension   . Hypothyroidism    Past Surgical History:  Procedure Laterality Date  . APPENDECTOMY  2014  . LAPAROSCOPIC APPENDECTOMY N/A 11/30/2013   Procedure: APPENDECTOMY LAPAROSCOPIC;  Surgeon: Adolph Pollack, MD;  Location: [facility];  Service: General;  Laterality: N/A;  . TUBAL LIGATION     Family History  Problem Relation Age of Onset  . Hypertension Mother   . Hypertension Father   . Emphysema Father   . Heart attack Brother    Social History   Socioeconomic History  . Marital status: Married    Spouse name: Not on file  . Number of children: Not on file  . Years of education: Not on file  . Highest education level: Not on file  Occupational History  . Not on file  Social Needs  . Financial resource strain: Not on file  . Food insecurity    Worry: Not on file    Inability:  Not on file  . Transportation needs    Medical: Not on file    Non-medical: Not on file  Tobacco Use  . Smoking status: Former Games developer  . Smokeless tobacco: Never Used  . Tobacco comment: quit 40+ years ago  Substance and Sexual Activity  . Alcohol use: Yes    Comment: occasional wine  . Drug use: Never  . Sexual activity: Yes    Partners: Male    Birth control/protection: Post-menopausal    Comment: husband  Lifestyle  . Physical activity    Days per week: Not on file    Minutes per session: Not on file  . Stress: Not on file  Relationships  . Social Musician on phone: Not on file    Gets together: Not on file    Attends religious service: Not on file    Active member of club or organization: Not on file    Attends meetings of clubs or organizations: Not on file    Relationship status: Not on file  Other Topics Concern  . Not on file  Social History Narrative  . Not on file    Review of Systems:  Per HPI. Did not report CP, SOB, cough, difficulties with urination, dizziness    Objective:   Vitals:   10/11/19 0831  BP: 126/72  Pulse: 76  SpO2: 97%  Exam: General: well appearing, NAD. HEENT: NCAT. Cardiovascular: RRR. No murmurs, rubs, or gallops. Respiratory: CTAB. No rales, rhonchi, or wheeze. Abdomen: soft, nontender, nondistended. Extremities: warm, well perfused. No LE edema. Skin: Warm, dry, intact. Neuro: No focal deficits. Pelvic Exam:        External: normal female genitalia without lesions or masses        Vagina: normal without lesions or masses        Cervix: normal without lesions or masses        Pap smear: performed        Samples for Wet prep, GC/Chlamydia obtained    Chemistry      Component Value Date/Time   NA 141 05/31/2019 1647   K 4.2 05/31/2019 1647   CL 102 05/31/2019 1647   CO2 24 05/31/2019 1647   BUN 17 05/31/2019 1647   CREATININE 0.96 05/31/2019 1647   CREATININE 0.78 09/01/2015 0927      Component Value  Date/Time   CALCIUM 10.0 05/31/2019 1647   ALKPHOS 161 (H) 03/14/2017 1043   AST 23 03/14/2017 1043   ALT 37 (H) 03/14/2017 1043   BILITOT 0.5 03/14/2017 1043      Lab Results  Component Value Date   WBC 5.9 03/14/2017   HGB 14.9 03/14/2017   HCT 45.2 03/14/2017   MCV 92 03/14/2017   PLT 308 03/14/2017   Lab Results  Component Value Date   TSH 2.800 05/31/2019   Lab Results  Component Value Date   HGBA1C 5.9 09/01/2015   Assessment/Plan:   GERD (gastroesophageal reflux disease) Uses OTC nexium, requested RX due to cost. RX given  Encounter for preventive health examination Patient here for annual wellness visit.  Due for Pap smear, obtained today.  Did not desire STD testing as she is only sexually active with her husband and has not been sexually active for some time due to husband's recent cardiac illness.  Patient had recent lab work and June which was within normal limits so did not desire any further lab testing today.  Encourage physical activity and daily exercise as well as healthy eating.  Patient to obtain flu vaccine today as well.  Have placed referral for colonoscopy as she is due, states she would like to schedule this on her own.  Mammogram appointment for 11/6.  Patient will request that they send her results over to our clinic so that we may update her records.  Follow-up in 1 year for annual wellness visit.  Obesity (BMI 30.0-34.9) Encouraged healthy diet and daily exercise.    Return in about 1 year (around 10/10/2020) for annual wellness exam.   Caroline More, DO, PGY-3

## 2019-10-11 ENCOUNTER — Encounter: Payer: Self-pay | Admitting: Family Medicine

## 2019-10-11 ENCOUNTER — Ambulatory Visit (INDEPENDENT_AMBULATORY_CARE_PROVIDER_SITE_OTHER): Payer: Managed Care, Other (non HMO) | Admitting: Family Medicine

## 2019-10-11 ENCOUNTER — Other Ambulatory Visit: Payer: Self-pay

## 2019-10-11 ENCOUNTER — Other Ambulatory Visit (HOSPITAL_COMMUNITY)
Admission: RE | Admit: 2019-10-11 | Discharge: 2019-10-11 | Disposition: A | Discharge status: Home / Self Care | Payer: Managed Care, Other (non HMO) | Source: Ambulatory Visit | Attending: Family Medicine | Admitting: Family Medicine

## 2019-10-11 VITALS — BP 126/72 | HR 76 | Wt 178.6 lb

## 2019-10-11 DIAGNOSIS — K219 Gastro-esophageal reflux disease without esophagitis: Secondary | ICD-10-CM | POA: Diagnosis not present

## 2019-10-11 DIAGNOSIS — Z1231 Encounter for screening mammogram for malignant neoplasm of breast: Secondary | ICD-10-CM

## 2019-10-11 DIAGNOSIS — Z124 Encounter for screening for malignant neoplasm of cervix: Secondary | ICD-10-CM | POA: Diagnosis not present

## 2019-10-11 DIAGNOSIS — Z1211 Encounter for screening for malignant neoplasm of colon: Secondary | ICD-10-CM | POA: Diagnosis not present

## 2019-10-11 DIAGNOSIS — Z0000 Encounter for general adult medical examination without abnormal findings: Secondary | ICD-10-CM

## 2019-10-11 DIAGNOSIS — Z23 Encounter for immunization: Secondary | ICD-10-CM | POA: Diagnosis not present

## 2019-10-11 DIAGNOSIS — E669 Obesity, unspecified: Secondary | ICD-10-CM

## 2019-10-11 MED ORDER — ESOMEPRAZOLE MAGNESIUM 20 MG PO PACK: 20.0000 mg | PACK | Freq: Every day | ORAL | 3 refills | Status: DC

## 2019-10-11 NOTE — Assessment & Plan Note (Signed)
Patient here for annual wellness visit.  Due for Pap smear, obtained today.  Did not desire STD testing as she is only sexually active with her husband and has not been sexually active for some time due to husband's recent cardiac illness.  Patient had recent lab work and June which was within normal limits so did not desire any further lab testing today.  Encourage physical activity and daily exercise as well as healthy eating.  Patient to obtain flu vaccine today as well.  Have placed referral for colonoscopy as she is due, states she would like to schedule this on her own.  Mammogram appointment for 11/6.  Patient will request that they send her results over to our clinic so that we may update her records.  Follow-up in 1 year for annual wellness visit.

## 2019-10-11 NOTE — Patient Instructions (Signed)
Healthy Eating Following a healthy eating pattern may help you to achieve and maintain a healthy body weight, reduce the risk of chronic disease, and live a long and productive life. It is important to follow a healthy eating pattern at an appropriate calorie level for your body. Your nutritional needs should be met primarily through food by choosing a variety of nutrient-rich foods. What are tips for following this plan? Reading food labels  Read labels and choose the following: ? Reduced or low sodium. ? Juices with 100% fruit juice. ? Foods with low saturated fats and high polyunsaturated and monounsaturated fats. ? Foods with whole grains, such as whole wheat, cracked wheat, brown rice, and wild rice. ? Whole grains that are fortified with folic acid. This is recommended for women who are pregnant or who want to become pregnant.  Read labels and avoid the following: ? Foods with a lot of added sugars. These include foods that contain brown sugar, corn sweetener, corn syrup, dextrose, fructose, glucose, high-fructose corn syrup, honey, invert sugar, lactose, malt syrup, maltose, molasses, raw sugar, sucrose, trehalose, or turbinado sugar.  Do not eat more than the following amounts of added sugar per day:  6 teaspoons (25 g) for women.  9 teaspoons (38 g) for men. ? Foods that contain processed or refined starches and grains. ? Refined grain products, such as white flour, degermed cornmeal, white bread, and white rice. Shopping  Choose nutrient-rich snacks, such as vegetables, whole fruits, and nuts. Avoid high-calorie and high-sugar snacks, such as potato chips, fruit snacks, and candy.  Use oil-based dressings and spreads on foods instead of solid fats such as butter, stick margarine, or cream cheese.  Limit pre-made sauces, mixes, and "instant" products such as flavored rice, instant noodles, and ready-made pasta.  Try more plant-protein sources, such as tofu, tempeh, black beans,  edamame, lentils, nuts, and seeds.  Explore eating plans such as the Mediterranean diet or vegetarian diet. Cooking  Use oil to saut or stir-fry foods instead of solid fats such as butter, stick margarine, or lard.  Try baking, boiling, grilling, or broiling instead of frying.  Remove the fatty part of meats before cooking.  Steam vegetables in water or broth. Meal planning   At meals, imagine dividing your plate into fourths: ? One-half of your plate is fruits and vegetables. ? One-fourth of your plate is whole grains. ? One-fourth of your plate is protein, especially lean meats, poultry, eggs, tofu, beans, or nuts.  Include low-fat dairy as part of your daily diet. Lifestyle  Choose healthy options in all settings, including home, work, school, restaurants, or stores.  Prepare your food safely: ? Wash your hands after handling raw meats. ? Keep food preparation surfaces clean by regularly washing with hot, soapy water. ? Keep raw meats separate from ready-to-eat foods, such as fruits and vegetables. ? Cook seafood, meat, poultry, and eggs to the recommended internal temperature. ? Store foods at safe temperatures. In general:  Keep cold foods at 20F (4.4C) or below.  Keep hot foods at 120F (60C) or above.  Keep your freezer at [facility] (-17.8C) or below.  Foods are no longer safe to eat when they have been between the temperatures of 40-120F (4.4-60C) for more than 2 hours. What foods should I eat? Fruits Aim to eat 2 cup-equivalents of fresh, canned (in natural juice), or frozen fruits each day. Examples of 1 cup-equivalent of fruit include 1 small apple, 8 large strawberries, 1 cup canned fruit,  cup  dried fruit, or 1 cup 100% juice. Vegetables Aim to eat 2-3 cup-equivalents of fresh and frozen vegetables each day, including different varieties and colors. Examples of 1 cup-equivalent of vegetables include 2 medium carrots, 2 cups raw, leafy greens, 1 cup chopped  vegetable (raw or cooked), or 1 medium baked potato. Grains Aim to eat 6 ounce-equivalents of whole grains each day. Examples of 1 ounce-equivalent of grains include 1 slice of bread, 1 cup ready-to-eat cereal, 3 cups popcorn, or  cup cooked rice, pasta, or cereal. Meats and other proteins Aim to eat 5-6 ounce-equivalents of protein each day. Examples of 1 ounce-equivalent of protein include 1 egg, 1/2 cup nuts or seeds, or 1 tablespoon (16 g) peanut butter. A cut of meat or fish that is the size of a deck of cards is about 3-4 ounce-equivalents.  Of the protein you eat each week, try to have at least 8 ounces come from seafood. This includes salmon, trout, herring, and anchovies. Dairy Aim to eat 3 cup-equivalents of fat-free or low-fat dairy each day. Examples of 1 cup-equivalent of dairy include 1 cup (240 mL) milk, 8 ounces (250 g) yogurt, 1 ounces (44 g) natural cheese, or 1 cup (240 mL) fortified soy milk. Fats and oils  Aim for about 5 teaspoons (21 g) per day. Choose monounsaturated fats, such as canola and olive oils, avocados, peanut butter, and most nuts, or polyunsaturated fats, such as sunflower, corn, and soybean oils, walnuts, pine nuts, sesame seeds, sunflower seeds, and flaxseed. Beverages  Aim for six 8-oz glasses of water per day. Limit coffee to three to five 8-oz cups per day.  Limit caffeinated beverages that have added calories, such as soda and energy drinks.  Limit alcohol intake to no more than 1 drink a day for nonpregnant women and 2 drinks a day for men. One drink equals 12 oz of beer (355 mL), 5 oz of wine (148 mL), or 1 oz of hard liquor (44 mL). Seasoning and other foods  Avoid adding excess amounts of salt to your foods. Try flavoring foods with herbs and spices instead of salt.  Avoid adding sugar to foods.  Try using oil-based dressings, sauces, and spreads instead of solid fats. This information is based on general [place] nutrition guidelines. For more  information, visit [employer].es. Exact amounts may vary based on your nutrition needs. Summary  A healthy eating plan may help you to maintain a healthy weight, reduce the risk of chronic diseases, and stay active throughout your life.  Plan your meals. Make sure you eat the right portions of a variety of nutrient-rich foods.  Try baking, boiling, grilling, or broiling instead of frying.  Choose healthy options in all settings, including home, work, school, restaurants, or stores. This information is not intended to replace advice given to you by your health care provider. Make sure you discuss any questions you have with your health care provider. Document Released: 03/23/2018 Document Revised: 03/23/2018 Document Reviewed: 03/23/2018 Elsevier Patient Education  2020 [employer].

## 2019-10-11 NOTE — Assessment & Plan Note (Signed)
Uses OTC nexium, requested RX due to cost. RX given

## 2019-10-11 NOTE — Assessment & Plan Note (Signed)
Encouraged healthy diet and daily exercise

## 2019-10-14 ENCOUNTER — Other Ambulatory Visit: Payer: Self-pay | Admitting: Family Medicine

## 2019-10-14 LAB — CYTOLOGY - PAP
Comment: NEGATIVE
Diagnosis: NEGATIVE
High risk HPV: NEGATIVE

## 2019-10-18 ENCOUNTER — Telehealth: Payer: Self-pay | Admitting: *Deleted

## 2019-10-18 NOTE — Telephone Encounter (Signed)
-----   Message from Sara More, DO sent at 10/14/2019  3:27 PM EDT ----- Please inform patient that results of pap smear are negative.

## 2019-10-18 NOTE — Telephone Encounter (Signed)
Pt informed. Deseree Blount, CMA  

## 2019-10-29 ENCOUNTER — Other Ambulatory Visit: Payer: Self-pay

## 2019-10-29 ENCOUNTER — Ambulatory Visit
Admission: RE | Admit: 2019-10-29 | Discharge: 2019-10-29 | Disposition: A | Discharge status: Home / Self Care | Payer: Managed Care, Other (non HMO) | Source: Ambulatory Visit | Attending: *Deleted | Admitting: *Deleted

## 2019-10-29 DIAGNOSIS — Z1231 Encounter for screening mammogram for malignant neoplasm of breast: Secondary | ICD-10-CM

## 2020-01-13 ENCOUNTER — Other Ambulatory Visit: Payer: Self-pay | Admitting: Family Medicine

## 2020-02-19 ENCOUNTER — Ambulatory Visit: Payer: Managed Care, Other (non HMO)

## 2020-04-19 ENCOUNTER — Other Ambulatory Visit: Payer: Self-pay | Admitting: Family Medicine

## 2020-05-03 ENCOUNTER — Other Ambulatory Visit: Payer: Self-pay | Admitting: Family Medicine

## 2020-06-08 ENCOUNTER — Other Ambulatory Visit: Payer: Self-pay

## 2020-06-08 MED ORDER — OLMESARTAN MEDOXOMIL-HCTZ 40-25 MG PO TABS: 1.0000 | ORAL_TABLET | Freq: Every day | ORAL | 2 refills | Status: DC

## 2020-07-24 ENCOUNTER — Other Ambulatory Visit: Payer: Self-pay | Admitting: *Deleted

## 2020-07-25 ENCOUNTER — Other Ambulatory Visit: Payer: Self-pay

## 2020-07-25 MED ORDER — AMLODIPINE BESYLATE 5 MG PO TABS: 5.0000 mg | ORAL_TABLET | Freq: Every day | ORAL | 0 refills | Status: DC

## 2020-07-25 MED ORDER — ATORVASTATIN CALCIUM 40 MG PO TABS: 40.0000 mg | ORAL_TABLET | Freq: Every day | ORAL | 0 refills | Status: DC

## 2020-09-21 ENCOUNTER — Other Ambulatory Visit: Payer: Self-pay | Admitting: Family Medicine

## 2020-09-21 NOTE — Telephone Encounter (Signed)
Received in [facility] Rx pool- request for Rx refill

## 2020-10-10 ENCOUNTER — Other Ambulatory Visit: Payer: Self-pay | Admitting: Family Medicine

## 2020-10-10 DIAGNOSIS — Z1231 Encounter for screening mammogram for malignant neoplasm of breast: Secondary | ICD-10-CM

## 2020-10-14 ENCOUNTER — Other Ambulatory Visit: Payer: Self-pay | Admitting: Family Medicine

## 2020-10-19 ENCOUNTER — Other Ambulatory Visit: Payer: Self-pay

## 2020-10-19 MED ORDER — LEVOTHYROXINE SODIUM 50 MCG PO TABS: 50.0000 ug | ORAL_TABLET | Freq: Every day | ORAL | 1 refills | Status: DC

## 2020-11-15 ENCOUNTER — Ambulatory Visit
Admission: RE | Admit: 2020-11-15 | Discharge: 2020-11-15 | Disposition: A | Discharge status: Home / Self Care | Payer: Managed Care, Other (non HMO) | Source: Ambulatory Visit | Attending: Family Medicine | Admitting: Family Medicine

## 2020-11-15 ENCOUNTER — Other Ambulatory Visit: Payer: Self-pay

## 2020-11-15 DIAGNOSIS — Z1231 Encounter for screening mammogram for malignant neoplasm of breast: Secondary | ICD-10-CM

## 2020-12-05 IMAGING — CR DG KNEE AP/LAT W/ SUNRISE*L*
3 series · 3 of 3 positions shown · non-contrast
Comparison: None.

CLINICAL DATA: Bilateral knee pain

EXAM:
LEFT KNEE 3 VIEWS

[w knee ap left]
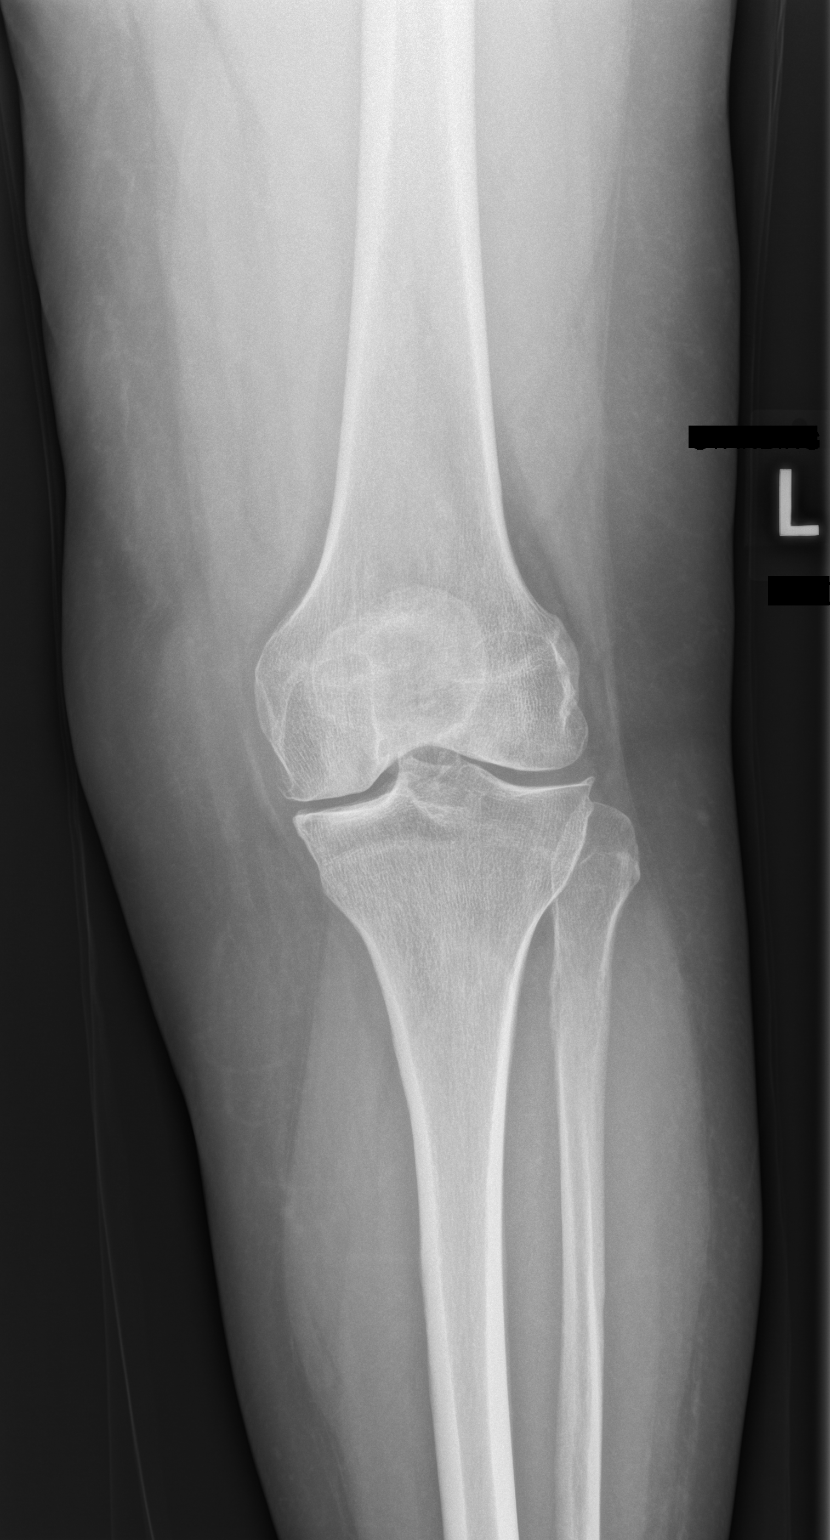

[w knee lat left]
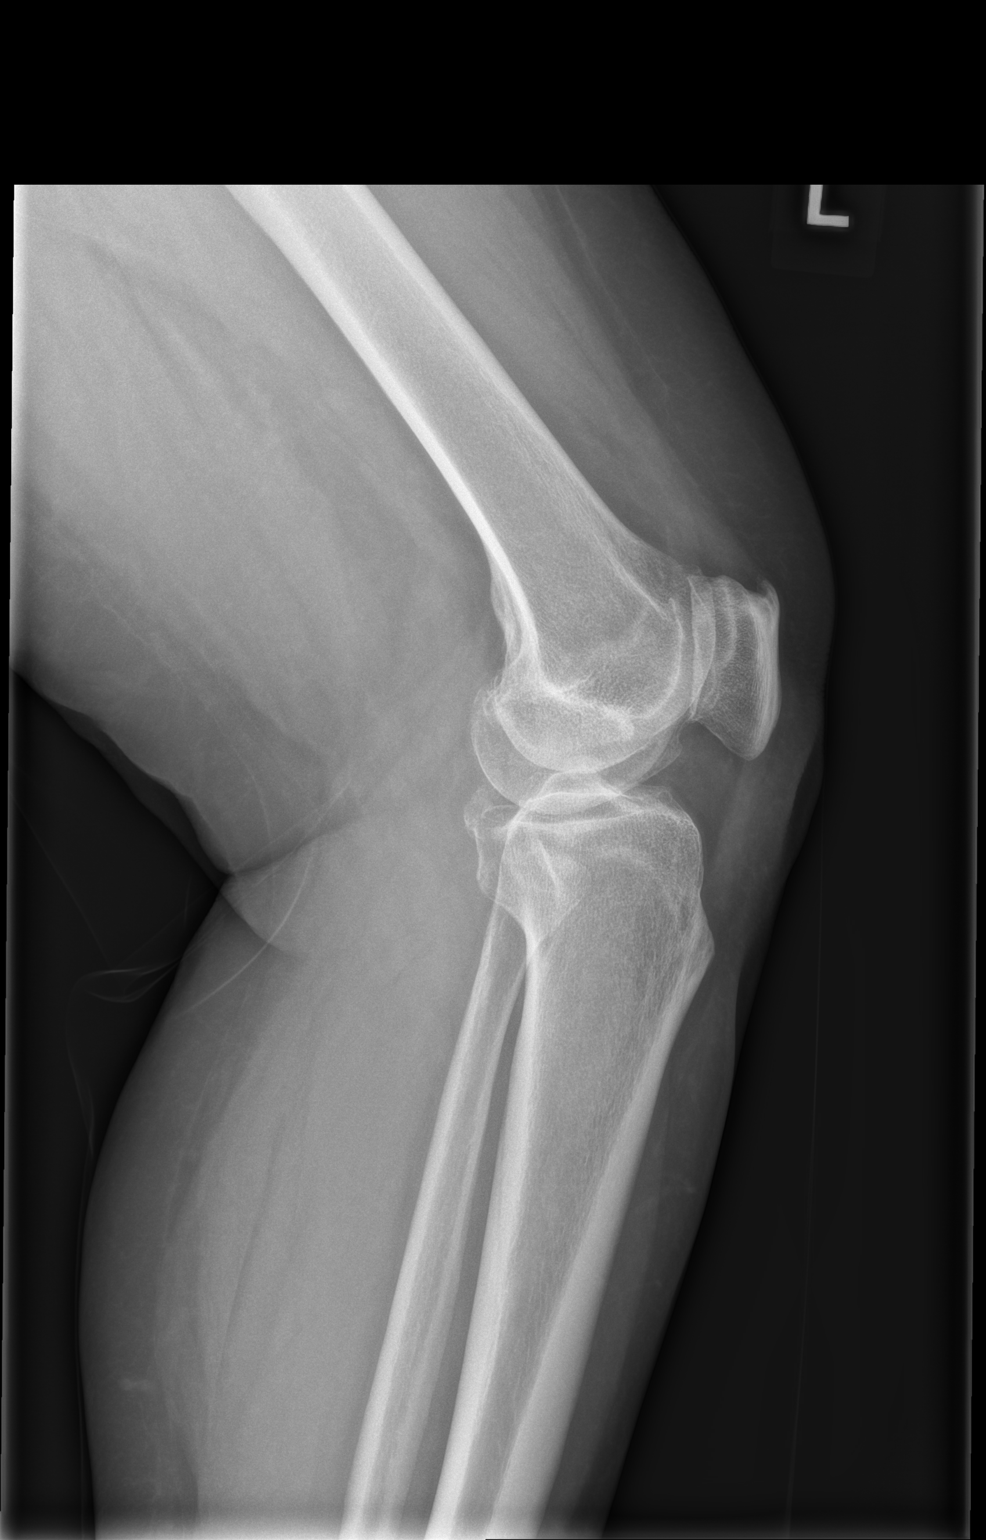

[x knee sunrise left]
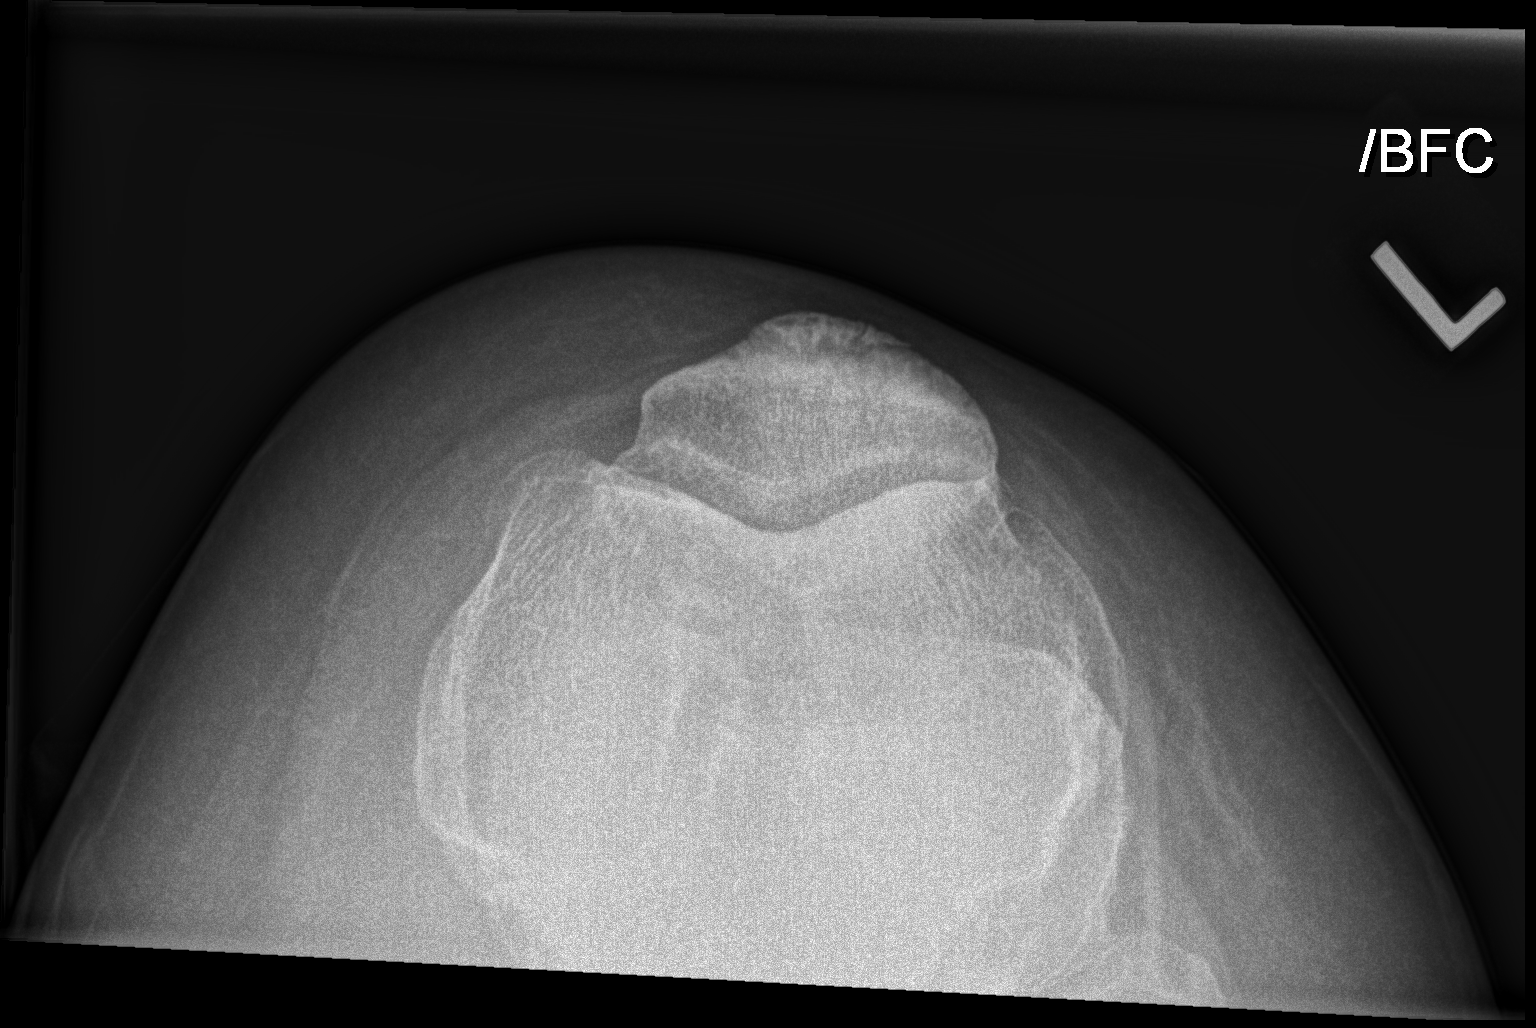

[3 of 3 positions shown; findings below may reference images not displayed]

FINDINGS: No acute fracture or malalignment. Mild tricompartment arthritis of
the knee. Suspect trace knee effusion.
IMPRESSION: No acute osseous abnormality. Mild arthritis with trace knee
effusion

## 2020-12-27 ENCOUNTER — Other Ambulatory Visit: Payer: Self-pay | Admitting: Family Medicine

## 2021-02-12 ENCOUNTER — Other Ambulatory Visit: Payer: Self-pay | Admitting: Family Medicine

## 2021-03-12 ENCOUNTER — Other Ambulatory Visit: Payer: Self-pay | Admitting: Family Medicine

## 2021-03-12 DIAGNOSIS — E038 Other specified hypothyroidism: Secondary | ICD-10-CM

## 2021-03-13 ENCOUNTER — Encounter: Payer: Self-pay | Admitting: Family Medicine

## 2021-03-13 ENCOUNTER — Other Ambulatory Visit: Payer: Self-pay | Admitting: Family Medicine

## 2021-03-13 NOTE — Telephone Encounter (Signed)
Patient with h/o hypothyroidism requesting refill of levothyroxine. Last visit to [facility] in June 2020. Needs new labs. Will refill levothyroxine and send MyChart message asking patient to schedule a new appointment for more refills.  Shirlean Mylar, MD [facility], PGY-2

## 2021-03-13 NOTE — Telephone Encounter (Signed)
Left voice message for patient to call and schedule an appointment for refills.  Glennie Hawk, CMA

## 2021-03-14 ENCOUNTER — Other Ambulatory Visit: Payer: Self-pay | Admitting: *Deleted

## 2021-03-14 ENCOUNTER — Other Ambulatory Visit: Payer: Self-pay | Admitting: Family Medicine

## 2021-03-14 NOTE — Telephone Encounter (Signed)
Will refill one month ONLY of medications. Patient has not been seen in clinic since June 2020, needs an appointment for any further refills. Will have Red team contact patient to make appointment.  Shirlean Mylar, MD [facility] Res[facility], PGY-2

## 2021-03-19 ENCOUNTER — Other Ambulatory Visit: Payer: Self-pay | Admitting: Family Medicine

## 2021-03-23 NOTE — Progress Notes (Signed)
Subjective:   Patient ID: Sara Ward    DOB: 1956/05/18, 65 y.o. female   MRN: [id number]  Sara Ward is a 65 y.o. female with a history of HTN, GERD, hypothyroidism, atrophic vaginitis, Vit D deficiency, prediabetes, obesity, HLD, chronic left knee pain  here for BP check and labs  Current concerns: Patient notes that she has been having trouble with sleep. She wakes up at 3am every night and sometimes unable to fall back asleep. She has not tried anything for this yet. Notes that she doesn't have any issues falling asleep, just staying asleep. She denies excessive exercise or caffeine.   HTN:  BP: 128/74 today. Currently on Olmesartan-HCTZ 40-25mg  and Amlodipine 5mg  QD. Endorses compliance. Non-smoker. Denies any chest pain, SOB, vision changes, or headaches.   Lab Results  Component Value Date   CREATININE 0.96 05/31/2019   CREATININE 0.79 03/14/2017   CREATININE 0.78 09/01/2015    HLD: Last lipid panel below. Currently on Lipitor 40mg  QD. Endorses compliance. Denies any muscles aches or weakness.   Lab Results  Component Value Date   CHOL 120 05/31/2019   HDL 51 05/31/2019   LDLCALC 43 05/31/2019   TRIG 132 05/31/2019   CHOLHDL 2.4 05/31/2019   Hypothyroidism: Last TSH below. Currently on Synthroid 07/31/2019 QD. Endorses compliance. Denies unexplained weight changes, skin chances, worsening anxiety, difficulty sleeping, diarrhea/constipation.  Lab Results  Component Value Date   TSH 2.800 05/31/2019   TSH 2.830 03/14/2017   TSH 3.329 09/01/2015   Health Maintenance: Health Maintenance Due  Topic  . COVID-19 Vaccine (1)  . COLONOSCOPY (Pts 45-56yrs Insurance coverage will need to be confirmed)   . DEXA SCAN   . PNA vac Low Risk Adult (1 of 2 - PCV13)  . TETANUS/TDAP     Review of Systems:  Per HPI.   Objective:   BP 128/74   Pulse 66   Ht 5' (1.524 m)   Wt 178 lb 12.8 oz (81.1 kg)   SpO2 96%   BMI 34.92 kg/m  Vitals and nursing note reviewed.  General:  pleasant older woman, sitting comfortably in exam chair, well nourished, well developed, in no acute distress with non-toxic appearance Neck: supple, non-tender without lymphadenopathy CV: regular rate and rhythm without murmurs, rubs, or gallops, no lower extremity edema, 2+ radial and posterior tibial pulses bilaterally Lungs: clear to auscultation bilaterally with normal work of breathing on room air, speaking in full sentences Skin: warm, dry Extremities: warm and well perfused Neuro: Alert and oriented, speech normal  Assessment & Plan:   Hypertension Chronic, well controlled. - continue Olmesartan-HCTZ 40-25mg  and Amlodipine 5mg  QD - BMP today to monitor kidney function  Hypothyroidism Chronic. Compliant with Synthroid without any side effects. - TSH today to monitor. Will adjust if indicated.  Vitamin D deficiency History of Vitamin D deficiency per problem list. Will obtain Vit D level to monitor.   Pre-diabetes Last A1C 5.9 in 2016. Weight about stable. A1C today to monitor.  Hyperlipidemia Chronic.  - continue lipitor 40mg  QD - lipid panel to monitor  Insomnia Acute x 1 month. Difficulty maintaining sleep. PHQ-9 score of 2, notable for difficulties with sleep. Recently retired. Denies excessive caffeine.  - discussed sleep hygiene and recommendations - recommended OTC Melatonin 3-5mg  PRN - follow up with PCP if no improvement  Orders Placed This Encounter  Procedures  . TSH  . Lipid Panel  . Basic Metabolic Panel  . Vitamin D, 25-hydroxy  . POCT glycosylated hemoglobin (Hb  A1C)   No orders of the defined types were placed in this encounter.  Recommended follow up with PCP for annual exam and health maintenance    Orpah Cobb, DO PGY-3, [facility] 03/26/2021 9:17 AM

## 2021-03-26 ENCOUNTER — Ambulatory Visit (INDEPENDENT_AMBULATORY_CARE_PROVIDER_SITE_OTHER): Payer: Self-pay | Admitting: Family Medicine

## 2021-03-26 ENCOUNTER — Encounter: Payer: Self-pay | Admitting: Family Medicine

## 2021-03-26 ENCOUNTER — Other Ambulatory Visit: Payer: Self-pay

## 2021-03-26 VITALS — BP 128/74 | HR 66 | Ht 60.0 in | Wt 178.8 lb

## 2021-03-26 DIAGNOSIS — E039 Hypothyroidism, unspecified: Secondary | ICD-10-CM

## 2021-03-26 DIAGNOSIS — E1169 Type 2 diabetes mellitus with other specified complication: Secondary | ICD-10-CM | POA: Diagnosis not present

## 2021-03-26 DIAGNOSIS — E559 Vitamin D deficiency, unspecified: Secondary | ICD-10-CM | POA: Diagnosis not present

## 2021-03-26 DIAGNOSIS — I10 Essential (primary) hypertension: Secondary | ICD-10-CM | POA: Diagnosis not present

## 2021-03-26 DIAGNOSIS — E785 Hyperlipidemia, unspecified: Secondary | ICD-10-CM | POA: Diagnosis not present

## 2021-03-26 DIAGNOSIS — F5101 Primary insomnia: Secondary | ICD-10-CM

## 2021-03-26 DIAGNOSIS — E782 Mixed hyperlipidemia: Secondary | ICD-10-CM

## 2021-03-26 DIAGNOSIS — G4700 Insomnia, unspecified: Secondary | ICD-10-CM | POA: Insufficient documentation

## 2021-03-26 DIAGNOSIS — R7303 Prediabetes: Secondary | ICD-10-CM

## 2021-03-26 LAB — POCT GLYCOSYLATED HEMOGLOBIN (HGB A1C): Hemoglobin A1C: 5.8 % — AB (ref 4.0–5.6)

## 2021-03-26 NOTE — Assessment & Plan Note (Signed)
Last A1C 5.9 in 2016. Weight about stable. A1C today to monitor.

## 2021-03-26 NOTE — Assessment & Plan Note (Signed)
Acute x 1 month. Difficulty maintaining sleep. PHQ-9 score of 2, notable for difficulties with sleep. Recently retired. Denies excessive caffeine.  - discussed sleep hygiene and recommendations - recommended OTC Melatonin 3-5mg  PRN - follow up with PCP if no improvement

## 2021-03-26 NOTE — Assessment & Plan Note (Signed)
Chronic, well controlled. - continue Olmesartan-HCTZ 40-25mg  and Amlodipine 5mg  QD - BMP today to monitor kidney function

## 2021-03-26 NOTE — Assessment & Plan Note (Signed)
History of Vitamin D deficiency per problem list. Will obtain Vit D level to monitor.

## 2021-03-26 NOTE — Patient Instructions (Signed)
It was a pleasure to see you today!  Thank you for choosing [facility] for your primary care.    Good sleep habits (sometimes referred to as "sleep hygiene") can help you get a good night's sleep. Some habits that can improve your sleep health:  Be consistent. Go to bed at the same time each night and get up at the same time each morning, including on the weekends (7 1/2 to 8 1/2 hours for the average adult) Make sure your bedroom is quiet, dark, relaxing, and at a comfortable temperature Remove electronic devices, such as TVs, computers, and smart phones, from the bedroom. Try not to use or play on electronic devices at bedtime. If you like to read at bedtime on an electronic device, try to dim the background light as much as possible. Avoid large meals, caffeine, and alcohol before bedtime (at least 2 hours before bedtime).  Get some exercise. Being physically active during the day can help you fall asleep more easily at night. But avoid exercise 2 hours prior to bedtime For stress relief, try meditation, deep breathing exercises (there are many books and CDs available), a white noise machine or fan can help to diffuse other noise distractors, such as traffic noise. Avoid narcotic pain medication close to bedtime, as opioids/narcotics can suppress breathing drive and breathing effort.  Never mix alcohol and sedating medications!  You can try Melatonin 3-5mg  upon waking.  Try leaving the bed and lying down in another location until you get tired and then return to bed to sleep.    We are checking some labs today, I will call you if they are abnormal will send you a MyChart message or a letter if they are normal.  If you do not hear about your labs in the next 2 weeks please let us know.  BRING ALL OF YOUR MEDICATIONS WITH YOU TO EVERY VISIT   You should return to our clinic at your earliest convenience for annual physical exam.   Best Wishes,   Orpah Cobb, DO

## 2021-03-26 NOTE — Assessment & Plan Note (Signed)
Chronic.  - continue lipitor 40mg  QD - lipid panel to monitor

## 2021-03-26 NOTE — Assessment & Plan Note (Signed)
Chronic. Compliant with Synthroid without any side effects. - TSH today to monitor. Will adjust if indicated.

## 2021-03-27 LAB — BASIC METABOLIC PANEL
BUN/Creatinine Ratio: 18 (ref 12–28)
BUN: 17 mg/dL (ref 8–27)
CO2: 25 mmol/L (ref 20–29)
Calcium: 10.5 mg/dL — ABNORMAL HIGH (ref 8.7–10.3)
Chloride: 101 mmol/L (ref 96–106)
Creatinine, Ser: 0.93 mg/dL (ref 0.57–1.00)
Glucose: 96 mg/dL (ref 65–99)
Potassium: 4.1 mmol/L (ref 3.5–5.2)
Sodium: 141 mmol/L (ref 134–144)
eGFR: 68 mL/min/{1.73_m2} (ref >59)

## 2021-03-27 LAB — VITAMIN D 25 HYDROXY (VIT D DEFICIENCY, FRACTURES): Vit D, 25-Hydroxy: 29.9 ng/mL — ABNORMAL LOW (ref 30.0–100.0)

## 2021-03-27 LAB — LIPID PANEL
Chol/HDL Ratio: 2.3 ratio (ref 0.0–4.4)
Cholesterol, Total: 136 mg/dL (ref 100–199)
HDL: 58 mg/dL (ref >39)
LDL Chol Calc (NIH): 63 mg/dL (ref 0–99)
Triglycerides: 73 mg/dL (ref 0–149)
VLDL Cholesterol Cal: 15 mg/dL (ref 5–40)

## 2021-03-27 LAB — TSH: TSH: 3.15 u[IU]/mL (ref 0.450–4.500)

## 2021-03-27 NOTE — Progress Notes (Signed)
Attempted to contact patient regarding lab results. LMoVM for call back.  If patient calls back please inform her that Vitamin D low - recommend OTC Vitamin D 1000IU daily  Recommend PCP recheck calcium level at follow up visit to ensure it remains stable. No further recommendations at this time.

## 2021-04-06 NOTE — Telephone Encounter (Signed)
On March 22, I received a refill request for this patient for levothyroxine. Patient has not been seen in >1 year. I refilled for 30 days and sent her a MyChart message to let her know to schedule an appointment for any further refills. I received a message that patient has not opened her MyChart, so I called her today and left a HIPAA compliant VM.   Shirlean Mylar, MD [facility], PGY-2

## 2021-04-09 ENCOUNTER — Other Ambulatory Visit: Payer: Self-pay

## 2021-04-09 NOTE — Telephone Encounter (Signed)
Patient LVM on nurse line requesting rx refill on olmesartan- HCTZ. Patient reports that she has one pill left. Patient has scheduled follow up visit with PCP on 5/10.  Please advise.   Veronda Prude, RN

## 2021-04-10 MED ORDER — OLMESARTAN MEDOXOMIL-HCTZ 40-25 MG PO TABS: 1.0000 | ORAL_TABLET | Freq: Every day | ORAL | 0 refills | Status: DC

## 2021-04-25 ENCOUNTER — Encounter: Payer: Self-pay | Admitting: Family Medicine

## 2021-04-25 ENCOUNTER — Other Ambulatory Visit: Payer: Self-pay

## 2021-04-25 ENCOUNTER — Ambulatory Visit: Payer: PPO | Admitting: Family Medicine

## 2021-04-25 VITALS — BP 106/64 | Ht 60.0 in | Wt 175.0 lb

## 2021-04-25 DIAGNOSIS — M170 Bilateral primary osteoarthritis of knee: Secondary | ICD-10-CM

## 2021-04-25 MED ORDER — METHYLPREDNISOLONE ACETATE 40 MG/ML IJ SUSP
40.0000 mg | Freq: Once | INTRAMUSCULAR | Status: AC
  Administered 2021-04-25: 40 mg via INTRA_ARTICULAR

## 2021-04-25 NOTE — Progress Notes (Signed)
   PCP: Lavonda Jumbo, DO  Subjective:   HPI: Patient is a 65 y.o. female with history of medial meniscal tear status post partial meniscectomy of the left knee and known bilateral knee osteoarthritis here for evaluation of left knee pain.  She was last seen here on 09/24/2019, at that time received bilateral knee CSI.  She reports that she had significant improvement in her symptoms and has been doing quite well with her regular activities until about a month ago when she started to have slow recurrence of symptoms.  Pain is mostly in the medial aspect of her knee.  No new activities that seem to aggravate her pain, and just came back slowly over the last month or so.  No trauma, no numbness or tingling, no weakness.   Review of Systems:  Per HPI.   PMFSH, medications and smoking status reviewed.      Objective:  Physical Exam:  No flowsheet data found.   Gen: awake, alert, NAD, comfortable in exam room Pulm: breathing unlabored  Left knee  Inspection: Bilateral knees without evidence of erythema, ecchymosis, swelling, edema. No effusion present  Active ROM: 5 to 150 degrees.  Strength: 5/5 strength to resisted flexion/extension without pain  Patella: Negative patellar grind. No patellar facet tenderness. No apprehension. No proximal or distal patellar tendon tenderness to palpation. No quad tendon tenderness to palpation.  Tibia: No tibial plateau, tibial tuberosity tenderness.  Joint line: + Medial joint line tenderness Popliteal: No popliteal tenderness to the insertional gastroc. No insertional biceps femoris, semimembranosis, semitendinosis tenderness.  Thessaly's: Positive for medial pain    Assessment & Plan:  1.  Left knee osteoarthritis Patient with recurrence of left medial knee pain which feels similar to her previous pain prior to last corticosteroid injection.  She had very significant and long-lasting relief with the injection so she would like to proceed with  this today which I think is reasonable.  This was done today without complication.  We will follow-up as needed based on symptoms.   Procedure note: After informed written consent timeout was performed, patient was seated on exam table. Right knee was prepped with alcohol swab and utilizing anteromedial approach, patient's right knee was injected intraarticularly with 3:1 bupivicaine: depomedrol. Patient tolerated the procedure well without immediate complications.   Guy Sandifer, MD [facility] 04/25/2021 3:28 PM  Addendum:  I was the preceptor for this visit and available for immediate consultation.  Norton Blizzard MD Marrianne Mood

## 2021-04-30 NOTE — Progress Notes (Signed)
    SUBJECTIVE:   CHIEF COMPLAINT / HPI:   Ms. Shults is a 65 year old female who presents to discuss labs and to follow-up:  Pre-diabetes A1c most recently 5.8.  Has family history.  Would like to know how to decrease number.  Low vitamin D Has started taking 1000 units daily.  Left knee pain S/p left knee injection 5/4.  States that she is doing well.  Less pain when bearing weight.  Continues to still have soreness at the medial side of the knee.  Healthcare maintenance Needs colonoscopy.  Stated she is called prior to book appointment but has not received a call back.  Plans to call again. Needs DEXA scan.  Prior DEXA scan 2002 and was on hormone replacement therapy.  PERTINENT  PMH / PSH: Hypothyroidism, HTN, HLD  OBJECTIVE:   BP 118/64   Pulse 78   Wt 178 lb (80.7 kg)   SpO2 96%   BMI 34.76 kg/m   General: Appears well, no acute distress. Age appropriate. Cardiac: RRR, normal heart sounds, no murmurs Respiratory: CTAB, normal effort MSK/Extremities: No gross deformity, ecchymoses, swelling. Medial joint like mildly TTP.   ASSESSMENT/PLAN:   Pre-diabetes Discussed lifestyle changes.  Follow-up in 1 year.  Vitamin D deficiency Vitamin D level 29.9 which is improved from the past.  Patient is taking 1000 units daily.  Continue medication and partake in outside activities as weather gets warm.  Chronic pain of left knee Improving. Recent knee injection. Follow up PRN for repeat injection if desired.  Healthcare maintenance - Call to schedule colonoscopy - Call to schedule DEXA scan - Received COVID booster today - Received Tdap today - Needs PVC-20 at follow-up   Lavonda Jumbo, DO [facility]

## 2021-05-01 ENCOUNTER — Encounter: Payer: Self-pay | Admitting: Family Medicine

## 2021-05-01 ENCOUNTER — Other Ambulatory Visit: Payer: Self-pay

## 2021-05-01 ENCOUNTER — Ambulatory Visit (INDEPENDENT_AMBULATORY_CARE_PROVIDER_SITE_OTHER): Payer: PPO | Admitting: Family Medicine

## 2021-05-01 ENCOUNTER — Ambulatory Visit (INDEPENDENT_AMBULATORY_CARE_PROVIDER_SITE_OTHER): Payer: PPO

## 2021-05-01 VITALS — BP 118/64 | HR 78 | Wt 178.0 lb

## 2021-05-01 DIAGNOSIS — G8929 Other chronic pain: Secondary | ICD-10-CM | POA: Diagnosis not present

## 2021-05-01 DIAGNOSIS — M25562 Pain in left knee: Secondary | ICD-10-CM

## 2021-05-01 DIAGNOSIS — Z23 Encounter for immunization: Secondary | ICD-10-CM

## 2021-05-01 DIAGNOSIS — Z0000 Encounter for general adult medical examination without abnormal findings: Secondary | ICD-10-CM | POA: Diagnosis not present

## 2021-05-01 DIAGNOSIS — R7303 Prediabetes: Secondary | ICD-10-CM

## 2021-05-01 DIAGNOSIS — E559 Vitamin D deficiency, unspecified: Secondary | ICD-10-CM

## 2021-05-01 NOTE — Assessment & Plan Note (Signed)
Discussed lifestyle changes.  Follow-up in 1 year.

## 2021-05-01 NOTE — Assessment & Plan Note (Signed)
-   Call to schedule colonoscopy - Call to schedule DEXA scan - Received COVID booster today - Received Tdap today - Needs PVC-20 at follow-up

## 2021-05-01 NOTE — Patient Instructions (Signed)
It was wonderful to see you today.  Please bring ALL of your medications with you to every visit.   Today we talked about:  Calling to make colonoscopy appointment  Continuing to take vitamin D and getting sunshine  Calling to schedule bone density scan  Focusing on healthy diet and increasing exercise for prediabetes.  Please be sure to schedule follow up at the front  desk before you leave today.   If you haven't already, sign up for My Chart to have easy access to your labs results, and communication with your primary care physician.  Please call the clinic at [phone] if your symptoms worsen or you have any concerns. It was our pleasure to serve you.  Dr. Salvadore Dom

## 2021-05-01 NOTE — Assessment & Plan Note (Signed)
Improving. Recent knee injection. Follow up PRN for repeat injection if desired.

## 2021-05-01 NOTE — Assessment & Plan Note (Signed)
Vitamin D level 29.9 which is improved from the past.  Patient is taking 1000 units daily.  Continue medication and partake in outside activities as weather gets warm.

## 2021-05-02 ENCOUNTER — Other Ambulatory Visit: Payer: Self-pay | Admitting: Family Medicine

## 2021-05-02 DIAGNOSIS — E038 Other specified hypothyroidism: Secondary | ICD-10-CM

## 2021-05-07 ENCOUNTER — Other Ambulatory Visit: Payer: Self-pay | Admitting: Family Medicine

## 2021-05-14 ENCOUNTER — Other Ambulatory Visit: Payer: Self-pay | Admitting: Family Medicine

## 2021-05-15 ENCOUNTER — Other Ambulatory Visit: Payer: Self-pay | Admitting: Family Medicine

## 2021-05-15 DIAGNOSIS — E038 Other specified hypothyroidism: Secondary | ICD-10-CM

## 2021-05-29 ENCOUNTER — Other Ambulatory Visit: Payer: Self-pay | Admitting: Family Medicine

## 2021-07-04 ENCOUNTER — Other Ambulatory Visit: Payer: Self-pay | Admitting: Family Medicine

## 2021-07-30 ENCOUNTER — Other Ambulatory Visit: Payer: Self-pay | Admitting: Family Medicine

## 2021-07-30 DIAGNOSIS — E038 Other specified hypothyroidism: Secondary | ICD-10-CM

## 2021-08-01 ENCOUNTER — Telehealth: Payer: Self-pay

## 2021-08-01 NOTE — Telephone Encounter (Signed)
LVM to have pt call back to schedule AWV.   RE: confirm insurance and schedule AWV on my schedule if times are convenient for patient or other AWV schedule as template permits.   

## 2021-08-14 ENCOUNTER — Other Ambulatory Visit: Payer: Self-pay | Admitting: Family Medicine

## 2021-08-14 DIAGNOSIS — E038 Other specified hypothyroidism: Secondary | ICD-10-CM

## 2021-09-17 NOTE — Telephone Encounter (Signed)
Spoke to pt and she declined visit at this time, requesting that this visit is pushed out several months.

## 2021-09-18 ENCOUNTER — Other Ambulatory Visit: Payer: Self-pay | Admitting: Family Medicine

## 2021-09-18 DIAGNOSIS — E038 Other specified hypothyroidism: Secondary | ICD-10-CM

## 2021-10-02 ENCOUNTER — Other Ambulatory Visit: Payer: Self-pay | Admitting: Family Medicine

## 2021-10-02 DIAGNOSIS — E038 Other specified hypothyroidism: Secondary | ICD-10-CM

## 2021-11-06 ENCOUNTER — Other Ambulatory Visit: Payer: Self-pay | Admitting: Family Medicine

## 2021-11-06 DIAGNOSIS — Z1231 Encounter for screening mammogram for malignant neoplasm of breast: Secondary | ICD-10-CM

## 2021-11-07 ENCOUNTER — Ambulatory Visit: Payer: PPO | Admitting: Sports Medicine

## 2021-11-07 VITALS — BP 112/64 | Ht 60.0 in | Wt 175.0 lb

## 2021-11-07 DIAGNOSIS — G8929 Other chronic pain: Secondary | ICD-10-CM | POA: Diagnosis not present

## 2021-11-07 DIAGNOSIS — M79672 Pain in left foot: Secondary | ICD-10-CM

## 2021-11-07 DIAGNOSIS — M25562 Pain in left knee: Secondary | ICD-10-CM

## 2021-11-07 DIAGNOSIS — R269 Unspecified abnormalities of gait and mobility: Secondary | ICD-10-CM | POA: Diagnosis not present

## 2021-11-07 DIAGNOSIS — M170 Bilateral primary osteoarthritis of knee: Secondary | ICD-10-CM | POA: Diagnosis not present

## 2021-11-07 MED ORDER — METHYLPREDNISOLONE ACETATE 40 MG/ML IJ SUSP
40.0000 mg | Freq: Once | INTRAMUSCULAR | Status: AC
  Administered 2021-11-07: 40 mg via INTRA_ARTICULAR

## 2021-11-07 NOTE — Patient Instructions (Signed)
It was great to meet you today, thank you for letting me participate in your care!  Today, we discussed your left knee pain - which is an aggravation of your underlying arthritis. Hopefully the cortisone injection gives you some relief over these next few days.  For your feet, would recommend doing some stretches for the calf/Achilles.  I will attach a handout for you to do these about once daily if possible.  You will follow-up in about 1 month if you are having any ongoing knee or feet pain.  If you have any further questions, please give the clinic a call [phone].   Madelyn Brunner, DO [facility]   Calf/Achilles/Foot Stretches:  Gastrocnemius stretch This exercise is also called a calf stretch. It stretches the muscles in the back of the lower leg (gastrocnemius). Sit with your left / right leg extended. Loop a belt or towel around the ball of your left / right foot. The ball of your foot is on the walking surface, right under your toes. Hold both ends of the belt or towel. Keep your left / right ankle and foot relaxed and keep your knee straight while you use the belt or towel to pull your foot and ankle toward you. Stop at the first point of resistance. Hold this position for __________ seconds.   Plantar flexion, standing  Stand with your feet shoulder-width apart. Place your hands on a wall or table to steady yourself as needed, but try not to use it for support. Rise up on your toes (plantar flexion). If this exercise is too easy, try these options: Shift your weight toward your left / right leg until you feel challenged. If told by your health care provider, stand on your left / right foot only. Hold this position for __________ seconds. Repeat __________ times. Complete this exercise __________ times a day. Eccentric plantar flexion  Stand on the balls of your feet on the edge of a step. The ball of your foot is on the walking surface, right under  your toes. Do not put your heels on the step. For balance, rest your hands on the wall or on a railing. Try not to lean on it for support. Rise up onto the balls of your feet, using both legs to help. Keeping your heels up, slowly shift all of your weight to your left / right foot and lift your other foot off the step. Slowly lower your left / right heel so it drops below the level of the step. Lowering your heel under tension is called eccentric plantar flexion. You will feel a slight stretch in your left / right calf. Put your other foot back onto the step before returning to the start position.  Stretching and range-of-motion exercises These exercises warm up your muscles and joints and improve the movement and flexibility of your lower leg and heel. These exercises also help to relieve pain. Dorsiflexion and plantar flexion  Sit with your left / right knee straight or bent. Do not rest your foot on anything. Move your left / right ankle to tilt the top of your foot toward your shin (dorsiflexion). Stop at the first point of resistance or at the angle where your health care provider told you to stop. Hold this position for __________ seconds. Point your toes downward to tilt the top of your foot away from your shin (plantar flexion). Hold this position for __________ seconds. Repeat __________ times. Complete this exercise __________ times a  day. Ankle alphabet  Sit with your left / right leg supported at the lower leg. Do not rest your foot on anything. Make sure your foot has room to move freely. Think of your left / right foot as a paintbrush, and move your foot to trace each letter of the alphabet in the air. Keep your hip and knee still while you trace the letters. Trace every letter of the alphabet. Repeat __________ times. Complete this exercise __________ times a day.

## 2021-11-07 NOTE — Progress Notes (Signed)
PCP: Lavonda Jumbo, DO  Subjective:   HPI: Patient is a pleasant 65 y.o. female here for evaluation of acute on chronic left knee pain.  Patient states she has had left knee pain for many years.  She is status post partial meniscectomy of the left knee years ago with known bilateral knee osteoarthritis.  She was last evaluated back in May 2022 and had good benefit from a cortisone injection at that time.  She denies any new injury to the knee.  She states that over the last few weeks to month her knee has been acting up again.  She has pain on both the medial and lateral joint line.  Has intermittent mild swelling, although no warmth, erythema or ecchymosis.  She will take over-the-counter anti-inflammatory such as ibuprofen at times when her pain is bad, although does not take anything consistently.  She does state that since the left knee has been bothering her, she feels like her feet and more specifically her left heel has been more bothersome as well.  She has pain at the posterior aspect of the left heel.  She states she has had this in the past and the leg pain got better after she had the knee injection.   BP 112/64   Ht 5' (1.524 m)   Wt 175 lb (79.4 kg)   BMI 34.18 kg/m   [facility] 11/07/2021  Frequency of aerobic exercise (# of days/week) 2  Average time in minutes 20  Frequency of strengthening activities (# of days/week) 2    No flowsheet data found.      Objective:  Physical Exam:  Gen: Well-appearing, in no acute distress; non-toxic CV: Regular Rate. Well-perfused. Warm.  Resp: Breathing unlabored on room air; no wheezing. Psych: Fluid speech in conversation; appropriate affect; normal thought process Neuro: Sensation intact throughout. No gross coordination deficits.  MSK:   - Left knee: + There is significant TTP over both the medial and lateral joint line.  Prominent medial fat pad.  There is trace effusion of the left knee.  No  erythema, ecchymosis or significant warmth.  Range of motion from 0 degrees - 125 degrees, with some pain near end range flexion.  Minimal crepitus with knee flexion/extension.  There is no instability varus or valgus stress at 0 and 30 degrees.  Negative Lachman, anterior/posterior drawer.  No Baker's cyst palpated.  Joint 5/5 in all directions.  Patient is neurovascular intact distally.  - Bilateral feet/gait analysis: No erythema, ecchymosis or swelling. + There is mild pain at the posterior aspect of the calcaneus upon deep palpation.  Slightly limited dorsiflexion of the left foot compared to the contralateral right.  Otherwise full range of motion and full strength.  Patient with preserved arch at rest, however collapse of the longitudinal arch bilaterally upon stance.  There is a mild navicular drop of the left with splaying of the first and second toe of the left foot.  There is overpronation upon stance phase with bilateral feet.  No significant lateral translation of the knees with walking, although there is a mild valgus movement with impaction.   Assessment & Plan:  1. Acute on chronic left knee pain 2. Bilateral knee osteoarthritis 3. Left foot/heel pain 4. Gait abnormality  Patient has acute on chronic left knee pain, which has responded well to corticosteroid injection in the past which has been over 6 months ago.  She does have some left foot/heel pain which could be indicative of  early plantar fasciitis versus pain from biomechanical alterations of her gait because of the knee pain.  I did provide the patient with handouts and discussed stretching of the calf muscles, Achilles and plantar fascia at home.  Through shared decision-making, we elected to repeat cortisone injection into the knee today.  Patient tolerated procedure well with mild pain.  We did discuss the option of correcting foot biomechanics with green sports insoles, however patient would like to see how she feels after the  cortisone injection and return in about 4 weeks for reevaluation, which I feel like is very fair.   Procedure: After informed written consent timeout was performed, patient was seated on exam table. The patient's left knee was prepped with Betadine and alcohol swab and utilizing anteromedial approach, the patient's knee was injected intraarticularly with 4:2 lidocaine: depomedrol. Patient tolerated the procedure well without immediate complications.  Madelyn Brunner, DO PGY-4, Sports Medicine Fellow [facility]   Addendum:  I was the preceptor for this visit and available for immediate consultation.  Norton Blizzard MD Marrianne Mood

## 2021-11-22 ENCOUNTER — Other Ambulatory Visit: Payer: Self-pay | Admitting: Family Medicine

## 2021-11-22 DIAGNOSIS — E038 Other specified hypothyroidism: Secondary | ICD-10-CM

## 2021-12-05 ENCOUNTER — Other Ambulatory Visit: Payer: Self-pay | Admitting: Family Medicine

## 2021-12-05 ENCOUNTER — Ambulatory Visit: Payer: PPO | Admitting: Sports Medicine

## 2021-12-05 DIAGNOSIS — E038 Other specified hypothyroidism: Secondary | ICD-10-CM

## 2021-12-10 ENCOUNTER — Other Ambulatory Visit: Payer: Self-pay

## 2021-12-10 ENCOUNTER — Ambulatory Visit
Admission: RE | Admit: 2021-12-10 | Discharge: 2021-12-10 | Disposition: A | Discharge status: Home / Self Care | Payer: PPO | Source: Ambulatory Visit | Attending: Family Medicine | Admitting: Family Medicine

## 2021-12-10 DIAGNOSIS — Z1231 Encounter for screening mammogram for malignant neoplasm of breast: Secondary | ICD-10-CM | POA: Diagnosis not present

## 2022-01-23 ENCOUNTER — Other Ambulatory Visit: Payer: Self-pay | Admitting: Family Medicine

## 2022-01-23 DIAGNOSIS — E038 Other specified hypothyroidism: Secondary | ICD-10-CM

## 2022-02-06 ENCOUNTER — Ambulatory Visit: Payer: PPO | Admitting: Sports Medicine

## 2022-02-06 VITALS — BP 124/72 | Ht 60.0 in | Wt 175.0 lb

## 2022-02-06 DIAGNOSIS — G8929 Other chronic pain: Secondary | ICD-10-CM

## 2022-02-06 DIAGNOSIS — M25562 Pain in left knee: Secondary | ICD-10-CM | POA: Diagnosis not present

## 2022-02-06 DIAGNOSIS — M1712 Unilateral primary osteoarthritis, left knee: Secondary | ICD-10-CM

## 2022-02-06 MED ORDER — METHYLPREDNISOLONE ACETATE 40 MG/ML IJ SUSP
40.0000 mg | Freq: Once | INTRAMUSCULAR | Status: AC
  Administered 2022-02-06: 40 mg via INTRA_ARTICULAR

## 2022-02-06 NOTE — Progress Notes (Signed)
PCP: Gerlene Fee, DO  Subjective:   HPI: Patient is a 66 y.o. female here for follow-up of chronic left knee pain.  Sara Ward follows up for her left knee pain.  She was last seen by me on 11/07/2021 and did have a cortisone injection into the knee at that time.  She states this did help briefly but feels like it wore off relatively quickly compared to when she has had in the past.  She has had left knee pain for many years now.  She denies any new injury to the knee.  She will have some crepitus about the knee, although denies any locking/clicking/catching or giving out.  She has pain both on the medial and lateral joint line.  She does take ibuprofen at times although tries not to take anything consistently.  She does feel like the knee is slightly more swollen and will get pain in the back part of the knee as well.  Denies any redness or warmth of the knee.  She has been trying to get back into the gym and be more active it will bother her on the treadmill with prolonged activity or when walking on an incline.  Past Medical History:  Diagnosis Date   Complication of anesthesia    hard to wake up   Hypertension    Hypothyroidism     Current Outpatient Medications on File Prior to Visit  Medication Sig Dispense Refill   amLODipine (NORVASC) 5 MG tablet TAKE 1 TABLET BY MOUTH EVERY DAY 30 tablet 0   aspirin 81 MG tablet Take 1 tablet (81 mg total) by mouth daily. 30 tablet    atorvastatin (LIPITOR) 40 MG tablet TAKE 1 TABLET BY MOUTH EVERY DAY 365 tablet 0   esomeprazole (NEXIUM) 20 MG capsule TAKE 1 CAPSULE BY MOUTH EVERY DAY BEFORE BREAKFAST 30 capsule 0   levothyroxine (SYNTHROID) 50 MCG tablet TAKE 1 TABLET BY MOUTH EVERY DAY 30 tablet 0   olmesartan-hydrochlorothiazide (BENICAR HCT) 40-25 MG tablet TAKE 1 TABLET BY MOUTH EVERY DAY 90 tablet 0   No current facility-administered medications on file prior to visit.    Past Surgical History:  Procedure Laterality Date    APPENDECTOMY  2014   LAPAROSCOPIC APPENDECTOMY N/A 11/30/2013   Procedure: APPENDECTOMY LAPAROSCOPIC;  Surgeon: Odis Hollingshead, MD;  Location: [facility];  Service: General;  Laterality: N/A;   TUBAL LIGATION      Allergies  Allergen Reactions   Contrast Media [Iodinated Contrast Media]     Itching and hives    BP 124/72    Ht 5' (1.524 m)    Wt 175 lb (79.4 kg)    BMI 34.18 kg/m   [facility] 11/07/2021  Frequency of aerobic exercise (# of days/week) 2  Average time in minutes 20  Frequency of strengthening activities (# of days/week) 2    No flowsheet data found.      Objective:  Physical Exam:  Gen: Well-appearing, in no acute distress; non-toxic CV: Regular Rate. Well-perfused. Warm.  Resp: Breathing unlabored on room air; no wheezing. Psych: Fluid speech in conversation; appropriate affect; normal thought process Neuro: Sensation intact throughout. No gross coordination deficits.  MSK:   - Bilateral hips: + mild TTP over bilateral greater trochanteric regions. There is diminished, 4/5 strength of bilateral hip lateral abduction. Full strength in flexion/extension. NVI.  - Left knee: + There is significant TTP over both the medial and lateral joint line. There is 1+ effusion of the  left knee. No erythema, ecchymosis or significant warmth.  Range of motion from 0 degrees - 125 degrees, with some pain near end range flexion. Minimal crepitus with knee flexion/extension.  There is no instability varus or valgus stress at 0 and 30 degrees.  Negative Lachman, anterior/posterior drawer.  No Baker's cyst palpated. Strength 5/5 in all directions.  Patient is neurovascular intact distally.   Assessment & Plan:  1. Acute on chronic left knee pain 2. Left knee osteoarthritis with history of menisectomy years ago 3. Bilateral hip abduction weakness   Procedure: Left knee aspiration and injection After discussion on R/B/I, informed written consent timeout  was performed, patient was lying supine on exam table.  Left knee was prepped with alcohol swab. Utilizing superolateral approach, 6 mL of lidocaine was used for local anesthesia. Then using an 18g needle on 60cc syringe, approximately 8 mL of clear straw-colored with blood-tinged fluid was aspirated from the left knee. Patient did have some discomfort with this aspiration. Knee was then subsequently injected in anterolateral approach with 1:2 lidocaine:depomedrol 40mg /mL. No bleeding or post-injection adverse effects noted.  Plan:  -Given the degree of the patient's pain, through shared decision making we elected to aspirate and subsequently inject the knee with cortisone today.  Patient did have some discomfort with aspiration although tolerated fine. -We discussed that it had been about 2-1/2 years since her last radiographs of the knees, given that the left knee has continued to become progressively painful we will repeat x-rays today, including: Bilateral AP standing and Rosenberg; left knee lateral and sunrise views.  Will call patient with these to discuss results. -Discussed with the patient pursuing additional treatment options if her pain does not improve, such as possible viscosupplementation.  We will await the review of her x-rays before determining next steps.  May also consider MRI depending on patient's pain response following the injection. -Home exercises provided for lateral hip abduction strengthening and stability, she does walk with a slightly Trendelenburg gait and strengthening the hips will hopefully offload the knees and improve her pain control as well  [name], DO PGY-4, [employer]  Addendum:  I was the preceptor for this visit and available for immediate consultation.  Karlton Lemon MD Kirt Boys

## 2022-02-21 ENCOUNTER — Other Ambulatory Visit: Payer: Self-pay | Admitting: Family Medicine

## 2022-02-21 DIAGNOSIS — E038 Other specified hypothyroidism: Secondary | ICD-10-CM

## 2022-03-07 ENCOUNTER — Other Ambulatory Visit: Payer: Self-pay | Admitting: Family Medicine

## 2022-04-03 ENCOUNTER — Other Ambulatory Visit: Payer: Self-pay | Admitting: Family Medicine

## 2022-04-03 DIAGNOSIS — E038 Other specified hypothyroidism: Secondary | ICD-10-CM

## 2022-05-02 ENCOUNTER — Other Ambulatory Visit: Payer: Self-pay | Admitting: Family Medicine

## 2022-05-02 DIAGNOSIS — E038 Other specified hypothyroidism: Secondary | ICD-10-CM

## 2022-05-28 ENCOUNTER — Encounter: Payer: Self-pay | Admitting: *Deleted

## 2022-06-13 ENCOUNTER — Other Ambulatory Visit: Payer: Self-pay | Admitting: Family Medicine

## 2022-06-19 ENCOUNTER — Ambulatory Visit: Payer: PPO | Admitting: Sports Medicine

## 2022-06-19 VITALS — BP 110/74 | Ht 60.0 in | Wt 175.0 lb

## 2022-06-19 DIAGNOSIS — M1712 Unilateral primary osteoarthritis, left knee: Secondary | ICD-10-CM | POA: Diagnosis not present

## 2022-06-19 DIAGNOSIS — G8929 Other chronic pain: Secondary | ICD-10-CM | POA: Diagnosis not present

## 2022-06-19 DIAGNOSIS — M25562 Pain in left knee: Secondary | ICD-10-CM

## 2022-06-19 MED ORDER — METHYLPREDNISOLONE ACETATE 40 MG/ML IJ SUSP
40.0000 mg | Freq: Once | INTRAMUSCULAR | Status: AC
  Administered 2022-06-19: 40 mg via INTRA_ARTICULAR

## 2022-06-19 NOTE — Progress Notes (Signed)
PCP: Lavonda Jumbo, DO  Subjective:   HPI: Sara Ward is a pleasant 66 y.o. female here for evaluation of acute on chronic left knee pain.  This has been a chronic issue for her with known bilateral knee osteoarthritis.  She is only last back in February 2023 and did have a knee aspiration and injection.  She does admit that the knee injections she has gotten over the years have still been helping although not to as long of the duration.  When she gets the knee injections though this does allow her to walk more and be more active without as much pain.  Here over the last few weeks her knee pain is started to become more aggravated.  She will intermittently get some swelling of the knee as well although denies any redness or fever chills.  Occasional take anti-inflammatories, although not taking anything consistently for the knee.   BP 110/74   Ht 5' (1.524 m)   Wt 175 lb (79.4 kg)   BMI 34.18 kg/m      11/07/2021   10:28 AM  [facility]  Frequency of aerobic exercise (# of days/week) 2  Average time in minutes 20  Frequency of strengthening activities (# of days/week) 2        No data to display              Objective:  Physical Exam:  Gen: Well-appearing, in no acute distress; non-toxic CV: Regular Rate. Well-perfused. Warm.  Resp: Breathing unlabored on room air; no wheezing. Psych: Fluid speech in conversation; appropriate affect; normal thought process Neuro: Sensation intact throughout. No gross coordination deficits.  MSK:  - Left knee: Inspection of the knee demonstrates very trace effusion of the knee.  No erythema, ecchymosis or significant warmth.  There is tenderness on bilateral joint lines, although medial greater than lateral. Range of motion is preserved from 0-125 degrees although pain with more endrange flexion.  There is some crepitus with knee flexion and extension.  No varus or valgus instability.  Ligamentously intact.  There is  likely a small Baker's cyst palpated within the posterior joint capsule.  5/5 strength in all directions.  Patient is neurovascular intact distally.    Assessment & Plan:  1. Acute on chronic left knee pain with known osteoarthritis  2.  Bilateral knee osteoarthritis 3.  History of plantar fascia/heel pain, left - improving  Procedure: Left Knee injection: After informed written consent timeout was performed, patient was seated on exam table. The patient's left knee was prepped with Betadine and alcohol swab and utilizing anterolateral approach, the patient's knee was injected intraarticularly with 4:2 lidocaine: depomedrol. Patient tolerated the procedure well without immediate complications.  -Through shared decision making, elected to proceed with left knee corticosteroid injection -Patient will ice and take over-the-counter anti-inflammatories for any postinjection pain -Recommend patient continue to stay active with walking, stationary bike -We did order x-rays of the knee at last visit, however she has been unable to get these done with other medical and family issues.  I did recommend that she get the symptom of the next few weeks so that we can gauge the degree of the arthritis progression in the left knee -If the regular corticosteroid injections are not giving her as much relief, she would likely be a good candidate for Zilretta in the future -Continue home exercises for the Achilles and left plantar fascia for maintenance -Follow-up with me in the next couple of months if not improved  Madelyn Brunner, DO PGY-4, Sports Medicine Fellow [facility]  This note was dictated using Dragon naturally speaking software and may contain errors in syntax, spelling, or content which have not been identified prior to signing this note.   Addendum:  I was the preceptor for this visit and available for immediate consultation.  Norton Blizzard MD Marrianne Mood

## 2022-07-09 ENCOUNTER — Other Ambulatory Visit: Payer: Self-pay | Admitting: Family Medicine

## 2022-07-09 DIAGNOSIS — E038 Other specified hypothyroidism: Secondary | ICD-10-CM

## 2022-08-28 ENCOUNTER — Other Ambulatory Visit: Payer: Self-pay | Admitting: Family Medicine

## 2022-08-28 DIAGNOSIS — E038 Other specified hypothyroidism: Secondary | ICD-10-CM

## 2022-09-26 ENCOUNTER — Other Ambulatory Visit: Payer: Self-pay | Admitting: Family Medicine

## 2022-09-26 DIAGNOSIS — E038 Other specified hypothyroidism: Secondary | ICD-10-CM

## 2022-10-14 ENCOUNTER — Other Ambulatory Visit: Payer: Self-pay | Admitting: Family Medicine

## 2022-10-14 DIAGNOSIS — E038 Other specified hypothyroidism: Secondary | ICD-10-CM

## 2022-11-06 ENCOUNTER — Other Ambulatory Visit: Payer: Self-pay | Admitting: Family Medicine

## 2022-11-06 DIAGNOSIS — I10 Essential (primary) hypertension: Secondary | ICD-10-CM

## 2022-11-07 ENCOUNTER — Encounter: Payer: Self-pay | Admitting: Family

## 2022-11-07 ENCOUNTER — Ambulatory Visit (INDEPENDENT_AMBULATORY_CARE_PROVIDER_SITE_OTHER): Payer: PPO | Admitting: Family

## 2022-11-07 VITALS — BP 122/84 | HR 68 | Temp 98.0°F | Resp 16 | Ht 60.0 in | Wt 179.2 lb

## 2022-11-07 DIAGNOSIS — E782 Mixed hyperlipidemia: Secondary | ICD-10-CM

## 2022-11-07 DIAGNOSIS — M25562 Pain in left knee: Secondary | ICD-10-CM

## 2022-11-07 DIAGNOSIS — I10 Essential (primary) hypertension: Secondary | ICD-10-CM

## 2022-11-07 DIAGNOSIS — Z1211 Encounter for screening for malignant neoplasm of colon: Secondary | ICD-10-CM | POA: Diagnosis not present

## 2022-11-07 DIAGNOSIS — Z7689 Persons encountering health services in other specified circumstances: Secondary | ICD-10-CM | POA: Diagnosis not present

## 2022-11-07 DIAGNOSIS — Z780 Asymptomatic menopausal state: Secondary | ICD-10-CM

## 2022-11-07 DIAGNOSIS — K219 Gastro-esophageal reflux disease without esophagitis: Secondary | ICD-10-CM

## 2022-11-07 DIAGNOSIS — E039 Hypothyroidism, unspecified: Secondary | ICD-10-CM

## 2022-11-07 DIAGNOSIS — G8929 Other chronic pain: Secondary | ICD-10-CM | POA: Diagnosis not present

## 2022-11-07 DIAGNOSIS — E559 Vitamin D deficiency, unspecified: Secondary | ICD-10-CM

## 2022-11-07 NOTE — Progress Notes (Signed)
Provider: Semiyah Newgent FNP-C   Lowry Ram, MD  Patient Care Team: Lowry Ram, MD as PCP - General (Family Medicine) West Pugh, NP (Inactive) as Nurse Practitioner (Gynecology)  Extended Emergency Contact Information Primary Emergency Contact: Sara Ward, Sara Ward Address: [address], [place] Phone: [phone] Relation: Spouse  Code Status:  Full Code  Goals of care: Advanced Directive information    11/07/2022    9:57 AM  Advanced Directives  Does Patient Have a Medical Advance Directive? No  Would patient like information on creating a medical advance directive? No - Patient declined     Chief Complaint  Patient presents with   Establish Care    New Patient.     HPI:  Pt is a 66 y.o. female seen today establish care here at [facility] for medical management of chronic diseases.  Has some medical history of essential hypertension, acquired hypothyroidism, hyperlipidemia, GERD, vitamin D deficiency, osteoarthritis, obesity among others. Left knee pain - has followed up with Orthopedic had cortisol injection 2-3 months ago but did not help with the pain.  Hypertension - No home B/p readings at home.  Currently on olmesartan -hydrochlorothiazide 40 -25 mg tablet daily and amlodipine 5 mg tablet daily.she denies any headache,dizziness,vision changes,fatigue,chest tightness,palpitation,chest pain or shortness of breath.    GERD -states symptoms controlled on Nexium. Denies any blood in the stool.  Hyperlipidemia -on atorvastatin 40 mg tablet daily.  Last lipid panel 1 year ago was normal.  Reports no myalgia or muscle weakness.No regular exercises  Hypothyroidism -last T SH level done 1 year ago was normal.  Currently on levothyroxine 50 mcg daily.  States takes medication on empty stomach.  Health maintenance: Due for screening colonoscopy.  Reports no blood in the stool or abdominal  pain.  Discussed colonoscopy versus Cologuard prefers to get Cologuard.  Also due for bone density.  Agrees for order to be placed at this visit.  Made aware imaging center will call her for appointment. Received letter for mammogram from imaging center but has not scheduled the appointment.  Patient will schedule appointment when she is called for bone density. Immunization due: COVID-19, pneumococcal vaccine and Shingrix vaccine per chart indicates due but patient will verify immunization records to see if she has gotten the vaccines.  Aware to get the COVID 19 booster vaccine and Shingrix at the pharmacy.  Past Medical History:  Diagnosis Date   Complication of anesthesia    hard to wake up   Hypertension    Hypothyroidism    Past Surgical History:  Procedure Laterality Date   APPENDECTOMY  2014   LAPAROSCOPIC APPENDECTOMY N/A 11/30/2013   Procedure: APPENDECTOMY LAPAROSCOPIC;  Surgeon: Odis Hollingshead, MD;  Location: [facility];  Service: General;  Laterality: N/A;   TUBAL LIGATION      Allergies  Allergen Reactions   Contrast Media [Iodinated Contrast Media]     Itching and hives    Allergies as of 11/07/2022       Reactions   Contrast Media [iodinated Contrast Media]    Itching and hives        Medication List        Accurate as of November 07, 2022 10:23 AM. If you have any questions, ask your nurse or doctor.          amLODipine 5 MG tablet Commonly known as: NORVASC TAKE  1 TABLET BY MOUTH EVERY DAY. Please make an appointment TO monitor blood pressure   aspirin 81 MG tablet Take 1 tablet (81 mg total) by mouth daily.   atorvastatin 40 MG tablet Commonly known as: LIPITOR TAKE 1 TABLET BY MOUTH EVERY DAY   CULTURELLE PO Take 1 capsule by mouth in the morning.   esomeprazole 20 MG capsule Commonly known as: NEXIUM TAKE 1 CAPSULE BY MOUTH EVERY DAY BEFORE breakfast   levothyroxine 50 MCG tablet Commonly known as: SYNTHROID TAKE 1 TABLET BY MOUTH  EVERY DAY. Please make an appointment TO check TSH AND make sure DOSE is appropriate.   MULTIVITAMIN PO Take 1 capsule by mouth as needed.   olmesartan-hydrochlorothiazide 40-25 MG tablet Commonly known as: BENICAR HCT TAKE 1 TABLET BY MOUTH EVERY DAY   VITAMIN D PO Take 1 capsule by mouth as needed.        Review of Systems  Constitutional:  Negative for appetite change, chills, fatigue, fever and unexpected weight change.  HENT:  Negative for congestion, dental problem, ear discharge, ear pain, facial swelling, hearing loss, nosebleeds, postnasal drip, rhinorrhea, sinus pressure, sinus pain, sneezing, sore throat, tinnitus and trouble swallowing.   Eyes:  Negative for pain, discharge, redness, itching and visual disturbance.  Respiratory:  Negative for cough, chest tightness, shortness of breath and wheezing.   Cardiovascular:  Negative for chest pain, palpitations and leg swelling.  Gastrointestinal:  Negative for abdominal distention, abdominal pain, blood in stool, constipation, diarrhea, nausea and vomiting.  Endocrine: Negative for cold intolerance, heat intolerance, polydipsia, polyphagia and polyuria.  Genitourinary:  Negative for difficulty urinating, dysuria, flank pain, frequency and urgency.  Musculoskeletal:  Positive for arthralgias. Negative for back pain, gait problem, joint swelling, myalgias, neck pain and neck stiffness.       Left knee chronic pain  Skin:  Negative for color change, pallor, rash and wound.  Neurological:  Negative for dizziness, syncope, speech difficulty, weakness, light-headedness, numbness and headaches.  Hematological:  Does not bruise/bleed easily.  Psychiatric/Behavioral:  Negative for agitation, behavioral problems, confusion, hallucinations, self-injury, sleep disturbance and suicidal ideas. The patient is not nervous/anxious.     Immunization History  Administered Date(s) Administered   Influenza,inj,Quad PF,6+ Mos 01/21/2018,  10/17/2018, 10/11/2019   Influenza-Unspecified 10/17/2018, 09/17/2022   PFIZER Comirnaty(Gray Top)Covid-19 Tri-Sucrose Vaccine 05/01/2021   Pfizer Covid-19 Vaccine Bivalent Booster 27yr & up 11/01/2021   Tdap 05/01/2021   Zoster Recombinat (Shingrix) 01/01/2022   Pertinent  Health Maintenance Due  Topic Date Due   COLONOSCOPY (Pts 45-479yrInsurance coverage will need to be confirmed)  Never done   DEXA SCAN  10/28/2019   MAMMOGRAM  12/11/2023   INFLUENZA VACCINE  Completed      01/21/2018   11:58 AM 05/31/2019    4:24 PM 10/11/2019    8:32 AM 11/07/2022    9:57 AM  Fall Risk  Falls in the past year? No 0 0 0  Was there an injury with Fall?    0  Fall Risk Category Calculator    0  Fall Risk Category    Low  Patient Fall Risk Level  Low fall risk Low fall risk Low fall risk  Patient at Risk for Falls Due to    No Fall Risks  Fall risk Follow up    Falls evaluation completed   Functional Status Survey:    Vitals:   11/07/22 0948  BP: 122/84  Pulse: 68  Resp: 16  Temp: 98 F (36.7 C)  SpO2: 99%  Weight: 179 lb 3.2 oz (81.3 kg)  Height: 5' (1.524 m)   Body mass index is 35 kg/m. Physical Exam Vitals reviewed.  Constitutional:      General: She is not in acute distress.    Appearance: Normal appearance. She is obese. She is not ill-appearing or diaphoretic.  HENT:     Head: Normocephalic.     Right Ear: Tympanic membrane, ear canal and external ear normal. There is no impacted cerumen.     Left Ear: Tympanic membrane, ear canal and external ear normal. There is no impacted cerumen.     Nose: Nose normal. No congestion or rhinorrhea.     Mouth/Throat:     Mouth: Mucous membranes are moist.     Pharynx: Oropharynx is clear. No oropharyngeal exudate or posterior oropharyngeal erythema.  Eyes:     General: No scleral icterus.       Right eye: No discharge.        Left eye: No discharge.     Extraocular Movements: Extraocular movements intact.      Conjunctiva/sclera: Conjunctivae normal.     Pupils: Pupils are equal, round, and reactive to light.  Neck:     Vascular: No carotid bruit.  Cardiovascular:     Rate and Rhythm: Normal rate and regular rhythm.     Pulses: Normal pulses.     Heart sounds: Normal heart sounds. No murmur heard.    No friction rub. No gallop.  Pulmonary:     Effort: Pulmonary effort is normal. No respiratory distress.     Breath sounds: Normal breath sounds. No wheezing, rhonchi or rales.  Chest:     Chest wall: No tenderness.  Abdominal:     General: Bowel sounds are normal. There is no distension.     Palpations: Abdomen is soft. There is no mass.     Tenderness: There is no abdominal tenderness. There is no right CVA tenderness, left CVA tenderness, guarding or rebound.  Musculoskeletal:        General: No swelling or tenderness. Normal range of motion.     Cervical back: Normal range of motion. No rigidity or tenderness.     Right knee: Normal.     Left knee: No swelling, effusion, erythema, ecchymosis or crepitus. Normal range of motion. No tenderness.     Right lower leg: No edema.     Left lower leg: No edema.  Lymphadenopathy:     Cervical: No cervical adenopathy.  Skin:    General: Skin is warm and dry.     Coloration: Skin is not pale.     Findings: No bruising, erythema, lesion or rash.  Neurological:     Mental Status: She is alert and oriented to person, place, and time.     Cranial Nerves: No cranial nerve deficit.     Sensory: No sensory deficit.     Motor: No weakness.     Coordination: Coordination normal.     Gait: Gait normal.  Psychiatric:        Mood and Affect: Mood normal.        Speech: Speech normal.        Behavior: Behavior normal.        Thought Content: Thought content normal.        Judgment: Judgment normal.     Labs reviewed: No results for input(s): "NA", "K", "CL", "CO2", "GLUCOSE", "BUN", "CREATININE", "CALCIUM", "MG", "PHOS" in the last 8760 hours. No  results for  input(s): "AST", "ALT", "ALKPHOS", "BILITOT", "PROT", "ALBUMIN" in the last 8760 hours. No results for input(s): "WBC", "NEUTROABS", "HGB", "HCT", "MCV", "PLT" in the last 8760 hours. Lab Results  Component Value Date   TSH 3.150 03/26/2021   Lab Results  Component Value Date   HGBA1C 5.8 (A) 03/26/2021   Lab Results  Component Value Date   CHOL 136 03/26/2021   HDL 58 03/26/2021   LDLCALC 63 03/26/2021   TRIG 73 03/26/2021   CHOLHDL 2.3 03/26/2021    Significant Diagnostic Results in last 30 days:  No results found.  Assessment/Plan  1. Encounter to establish care Available medical records reviewed, recommend obtaining immunization records then will update but aware to get COVID-19 booster and Shingrix vaccine at the pharmacy.  We will need to verify pneumococcal vaccine.  Also recommended scheduling for fasting blood work last lab work done was 1 year ago.  2. Chronic pain of left knee Chronic has had cortisone injection in the past 2 to 3 months ago but did not relieve pain.No previous imaging has been done. Will obtain imaging then referred to orthopedic if indicated -Recommend over-the-counter extra strength Tylenol for arthritis as needed for pain - DG Knee Complete 4 Views Left; Future  3. Acquired hypothyroidism Previous TSH 1 year ago was normal Continue on levothyroxine 50 mcg daily on empty stomach also advised to take it 2 hours away from other medication - TSH; Future  4. Mixed hyperlipidemia Previous LDL was within normal range -Dietary modification and exercise advised. Exercise limited due to left knee pain but have discussed water aerobics or swimming which will be suitable for the knee pain - Lipid panel; Future  5. Gastroesophageal reflux disease without esophagitis Symptoms controlled. H/H stable.No tarry or black stool  - advised to avoid eating meals late in the evening and to avoid aggravating foods and spices. - continue on Nexium -  COMPLETE METABOLIC PANEL WITH GFR; Future  6. Postmenopausal estrogen deficiency No recent fall or fractures.  Made aware imaging center will call her for appointment for bone density -Continue on multivitamin and vitamin D supplement -Encouraged weightbearing exercises such as walking or water aerobics - DG Bone Density  7. Colon cancer screening Symptomatic.Colonoscopy versus Cologuard discussed patient prefers Cologuard. - Cologuard  8. Essential hypertension Blood pressure well controlled Continue on olmesartan -hydrochlorothiazide 40 -25 mg tablet daily and amlodipine 5 mg tablet daily -Dietary modification and exercise discussed as above - CBC with Differential/Platelet; Future  9. Vitamin D deficiency Continue on vitamin D supplement. - Vitamin D, 1,25-dihydroxy; Future  Family/ staff Communication: Reviewed plan of care with patient verbalized understanding  Labs/tests ordered:  - CBC with Differential/Platelet - CMP with eGFR([facility]) - TSH - Lipid panel - Vitamin D level  - DG Bone Density - Cologuard  Next Appointment : Return in about 6 months (around 05/08/2023) for fasting labs in one week.   Sandrea Hughs, NP

## 2022-11-13 ENCOUNTER — Other Ambulatory Visit: Payer: Self-pay

## 2022-11-13 ENCOUNTER — Other Ambulatory Visit: Payer: PPO

## 2022-11-13 ENCOUNTER — Ambulatory Visit
Admission: RE | Admit: 2022-11-13 | Discharge: 2022-11-13 | Disposition: A | Discharge status: Home / Self Care | Payer: PPO | Source: Ambulatory Visit | Attending: Family | Admitting: Family

## 2022-11-13 DIAGNOSIS — I10 Essential (primary) hypertension: Secondary | ICD-10-CM

## 2022-11-13 DIAGNOSIS — E039 Hypothyroidism, unspecified: Secondary | ICD-10-CM | POA: Diagnosis not present

## 2022-11-13 DIAGNOSIS — K219 Gastro-esophageal reflux disease without esophagitis: Secondary | ICD-10-CM

## 2022-11-13 DIAGNOSIS — E559 Vitamin D deficiency, unspecified: Secondary | ICD-10-CM

## 2022-11-13 DIAGNOSIS — E782 Mixed hyperlipidemia: Secondary | ICD-10-CM

## 2022-11-13 DIAGNOSIS — M25562 Pain in left knee: Secondary | ICD-10-CM | POA: Diagnosis not present

## 2022-11-13 DIAGNOSIS — E038 Other specified hypothyroidism: Secondary | ICD-10-CM

## 2022-11-13 DIAGNOSIS — G8929 Other chronic pain: Secondary | ICD-10-CM

## 2022-11-13 MED ORDER — AMLODIPINE BESYLATE 5 MG PO TABS: ORAL_TABLET | ORAL | 3 refills | Status: DC

## 2022-11-13 MED ORDER — LEVOTHYROXINE SODIUM 50 MCG PO TABS: ORAL_TABLET | ORAL | 0 refills | Status: DC

## 2022-11-18 ENCOUNTER — Telehealth: Payer: Self-pay

## 2022-11-18 LAB — CBC WITH DIFFERENTIAL/PLATELET
Absolute Monocytes: 415 cells/uL (ref 200–950)
Basophils Absolute: 61 cells/uL (ref 0–200)
Basophils Relative: 1 %
Eosinophils Absolute: 250 cells/uL (ref 15–500)
Eosinophils Relative: 4.1 %
HCT: 40.2 % (ref 35.0–45.0)
Hemoglobin: 13.5 g/dL (ref 11.7–15.5)
Lymphs Abs: 1818 cells/uL (ref 850–3900)
MCH: 30.5 pg (ref 27.0–33.0)
MCHC: 33.6 g/dL (ref 32.0–36.0)
MCV: 91 fL (ref 80.0–100.0)
MPV: 11.4 fL (ref 7.5–12.5)
Monocytes Relative: 6.8 %
Neutro Abs: 3556 cells/uL (ref 1500–7800)
Neutrophils Relative %: 58.3 %
Platelets: 263 10*3/uL (ref 140–400)
RBC: 4.42 10*6/uL (ref 3.80–5.10)
RDW: 11.6 % (ref 11.0–15.0)
Total Lymphocyte: 29.8 %
WBC: 6.1 10*3/uL (ref 3.8–10.8)

## 2022-11-18 LAB — COMPLETE METABOLIC PANEL WITH GFR
AG Ratio: 1.6 (calc) (ref 1.0–2.5)
ALT: 24 U/L (ref 6–29)
AST: 21 U/L (ref 10–35)
Albumin: 4.4 g/dL (ref 3.6–5.1)
Alkaline phosphatase (APISO): 126 U/L (ref 37–153)
BUN: 20 mg/dL (ref 7–25)
CO2: 30 mmol/L (ref 20–32)
Calcium: 10.3 mg/dL (ref 8.6–10.4)
Chloride: 102 mmol/L (ref 98–110)
Creat: 0.82 mg/dL (ref 0.50–1.05)
Globulin: 2.7 g/dL (calc) (ref 1.9–3.7)
Glucose, Bld: 105 mg/dL — ABNORMAL HIGH (ref 65–99)
Potassium: 4.1 mmol/L (ref 3.5–5.3)
Sodium: 140 mmol/L (ref 135–146)
Total Bilirubin: 0.4 mg/dL (ref 0.2–1.2)
Total Protein: 7.1 g/dL (ref 6.1–8.1)
eGFR: 78 mL/min/{1.73_m2} (ref >=60)

## 2022-11-18 LAB — VITAMIN D 1,25 DIHYDROXY
Vitamin D 1, 25 (OH)2 Total: 68 pg/mL (ref 18–72)
Vitamin D2 1, 25 (OH)2: <8 pg/mL
Vitamin D3 1, 25 (OH)2: 68 pg/mL

## 2022-11-18 LAB — LIPID PANEL
Cholesterol: 128 mg/dL (ref <200)
HDL: 57 mg/dL (ref >=50)
LDL Cholesterol (Calc): 57 mg/dL (calc)
Non-HDL Cholesterol (Calc): 71 mg/dL (calc) (ref <130)
Total CHOL/HDL Ratio: 2.2 (calc) (ref <5.0)
Triglycerides: 60 mg/dL (ref <150)

## 2022-11-18 LAB — TSH: TSH: 4.8 mIU/L — ABNORMAL HIGH (ref 0.40–4.50)

## 2022-11-18 NOTE — Telephone Encounter (Signed)
-----   Message from Caesar Bookman, NP sent at 11/18/2022  1:21 PM EST ----- Left knee X-ray results indicates degenerative changes ( arthritis ) recommend referral to Orthopedic for further evaluation.

## 2022-11-18 NOTE — Telephone Encounter (Signed)
Left message for patient to call back or review mychart for results and recommendations.

## 2022-11-20 ENCOUNTER — Telehealth: Payer: Self-pay

## 2022-11-20 ENCOUNTER — Other Ambulatory Visit: Payer: Self-pay | Admitting: Family

## 2022-11-20 DIAGNOSIS — G8929 Other chronic pain: Secondary | ICD-10-CM

## 2022-11-20 DIAGNOSIS — E038 Other specified hypothyroidism: Secondary | ICD-10-CM

## 2022-11-20 DIAGNOSIS — I10 Essential (primary) hypertension: Secondary | ICD-10-CM

## 2022-11-20 MED ORDER — ATORVASTATIN CALCIUM 40 MG PO TABS: 40.0000 mg | ORAL_TABLET | Freq: Every day | ORAL | 1 refills | Status: DC

## 2022-11-20 MED ORDER — LEVOTHYROXINE SODIUM 50 MCG PO TABS: ORAL_TABLET | ORAL | 1 refills | Status: DC

## 2022-11-20 MED ORDER — OLMESARTAN MEDOXOMIL-HCTZ 40-25 MG PO TABS: 1.0000 | ORAL_TABLET | Freq: Every day | ORAL | 1 refills | Status: DC

## 2022-11-20 MED ORDER — ESOMEPRAZOLE MAGNESIUM 20 MG PO CPDR: DELAYED_RELEASE_CAPSULE | ORAL | 3 refills | Status: DC

## 2022-11-20 NOTE — Addendum Note (Signed)
Addended by: Cheron Every C on: 11/20/2022 12:29 PM   Modules accepted: Orders

## 2022-11-20 NOTE — Telephone Encounter (Signed)
May refill all medication as requested. Please schedule appointment for evaluation of sore throat will need to screen for strep and other acute abnormalities prior to prescribing medication.

## 2022-11-20 NOTE — Telephone Encounter (Signed)
Patient was advised and stated that she will wait  1-2 days before making an appointment to be evaluated. Medications was send into pharmacy.

## 2022-11-20 NOTE — Telephone Encounter (Signed)
Patient called and requested a refill on al medications and they were never filled by Dinah Ngetich,NP. Also patient would like something called into the pharmacy for a sorethroat she reports feels like she is choking. She was advised an appointment is required and stated that she was just in the office this month and didn't want to come in just would like a prescription send into pharmacy. Please advise

## 2022-11-25 DIAGNOSIS — Z1211 Encounter for screening for malignant neoplasm of colon: Secondary | ICD-10-CM | POA: Diagnosis not present

## 2022-12-02 ENCOUNTER — Encounter: Payer: Self-pay | Admitting: Sports Medicine

## 2022-12-02 ENCOUNTER — Ambulatory Visit (INDEPENDENT_AMBULATORY_CARE_PROVIDER_SITE_OTHER): Payer: PPO

## 2022-12-02 ENCOUNTER — Ambulatory Visit: Payer: PPO | Admitting: Sports Medicine

## 2022-12-02 DIAGNOSIS — G8929 Other chronic pain: Secondary | ICD-10-CM

## 2022-12-02 DIAGNOSIS — M1712 Unilateral primary osteoarthritis, left knee: Secondary | ICD-10-CM | POA: Diagnosis not present

## 2022-12-02 DIAGNOSIS — M25562 Pain in left knee: Secondary | ICD-10-CM

## 2022-12-02 LAB — COLOGUARD: COLOGUARD: NEGATIVE

## 2022-12-02 NOTE — Patient Instructions (Addendum)
[name],  It was good seeing you again. I'm glad the knee is hanging in there. Here are a few things to do for knee arthritis and pain:  1.)  Keep your body moving -keep working on flexibility and range of motion for the knee.  Good physical activity include stationary bike, swimming, elliptical.  2.)  Maintain a healthy weight --> 1 pound of body weight = 4 pounds of weight on the knees.  Even small weight loss can significantly improve pain  3.)  Supplements helpful for arthritis and inflammation: Turmeric (curcumin) Boswellia serrata, collagen hydrolysate, Glucosamine-chondroitin 4.)  Foods helpful for arthritis and inflammation: Fish and foods containing omega-3's, leafy greens (broccoli, spinach), citrus fruits (grapefruit, oranges, lemons), green tea, blueberries, cherries, olive oil (EVO)  *Very important to stay hydrated, drinking lots of water throughout the day  5.) Medicines:    - Topical pain relievers: Voltaren gel, Arnica gel, Tiger balm, lidocaine/IcyHot patches    -Anti-inflammatories like Aleve, Motrin, ibuprofen --> we can consider prescription based NSAIDs as well if needed   **I did order the longer-acting steroid (Zilretta) - this will take some time for approval for insurance. We will call you when this goes through.   - Dr. Shon Baton

## 2022-12-02 NOTE — Progress Notes (Signed)
Sara Ward - 66 y.o. female MRN [id number]  Date of birth: 1956/06/01  Office Visit Note: Visit Date: 12/02/2022 PCP: Caesar Bookman, NP Referred by: Caesar Bookman, NP  Subjective: Chief Complaint  Patient presents with   Left Knee - Pain   HPI: Sara Ward is a pleasant 66 y.o. female who presents today for chronic left knee pain.   History of left chronic knee pain with known osteoarthritis.  Has found benefit from injections before.  She would like to discuss what other treatment options she has and prevention methods.  The knee is doing fairly well.  Does get intermittent swelling about the knee with activity.  Patient and she will take Aleve only as needed, maybe once a week.  She has gotten new [employer] which she does find supportive for the feet and knees.  She has had knee corticosteroid injections, her first 1-2 lasted for many months, however more recently previous injections have helped but with a shorter duration.  Last and only helped for a few weeks.  Pertinent ROS were reviewed with the patient and found to be negative unless otherwise specified above in HPI.   Assessment & Plan: Visit Diagnoses:  1. Chronic pain of left knee   2. Unilateral primary osteoarthritis, left knee    Plan: Discussed with Sara Ward all treatment options for her left knee pain.  She does have underlying osteoarthritis with more advanced medial joint space narrowing.  We did discuss both holistic and pharmacologic/intervention modalities today.  Did provide her with a home exercise series to work on strengthening the quadricep and surrounding the structures, as well as stretching and maintaining range of motion about the knee.  We discussed healthy weight loss, good physical activity exercises.  Did provide a handout for supplements and foods known to help with anti-inflammatory properties.  She may also try topical pain relievers such as Voltaren gel, may take occasional oral  anti-inflammatories as needed.  She has gotten good relief from corticosteroid injections in the past, however recently these have not been helping as long.  Given this, I do think she would be an excellent candidate for Zilretta, long-acting steroid injection.  We will send this off for insurance approval and give her a call to proceed with this once approved. She will follow-up sooner if any issues arise.  Follow-up: Will call once Zilretta injection approved via insurance   Meds & Orders: No orders of the defined types were placed in this encounter.   Orders Placed This Encounter  Procedures   XR Knee 1-2 Views Left     Procedures: No procedures performed      Clinical History: No specialty comments available.  She reports that she has quit smoking. She has never used smokeless tobacco. No results for input(s): "HGBA1C", "LABURIC" in the last 8760 hours.  Objective:   Vital Signs: There were no vitals taken for this visit.  Physical Exam  Gen: Well-appearing, in no acute distress; non-toxic CV: Regular Rate. Well-perfused. Warm.  Resp: Breathing unlabored on room air; no wheezing. Psych: Fluid speech in conversation; appropriate affect; normal thought process Neuro: Sensation intact throughout. No gross coordination deficits.   Ortho Exam - Left knee:   Imaging: XR Knee 1-2 Views Left  Result Date: 12/02/2022 2 views of the left knee including bilateral standing AP and Zoila Shutter views were ordered and reviewed by myself.  X-rays demonstrate mild tricompartmental arthritic change, with moderate to severe medial joint space narrowing noted  on Lutricia Feil view of the left medial knee.  Mild arthritic change of the right knee.  There is bilateral lateral tibial plateau spurring.  No acute fracture noted. There is a mild valgus shift of the left knee on standing.   *I independently reviewed complete knee x-rays of the left knee from 11/13/2022.  There is moderate tricompartmental  degenerative change throughout the knee, most significantly in the medial joint space.  There is a degree of valgus shift noted as well.  Spurring of the superior patella.  No acute fracture noted.  XR Knee 1-2 Views Left 2 views of the left knee including bilateral standing AP and Lutricia Feil  views were ordered and reviewed by myself.  X-rays demonstrate mild  tricompartmental arthritic change, with moderate to severe medial joint  space narrowing noted on Lutricia Feil view of the left medial knee.  Mild  arthritic change of the right knee.  There is bilateral lateral tibial  plateau spurring.  No acute fracture noted. There is a mild valgus shift  of the left knee on standing.    Past Medical/Family/Surgical/Social History: Medications & Allergies reviewed per EMR, new medications updated. Patient Active Problem List   Diagnosis Date Noted   Healthcare maintenance 05/01/2021   Insomnia 03/26/2021   GERD (gastroesophageal reflux disease) 10/11/2019   Joint pain 05/31/2019   Hyperlipidemia 05/31/2019   Chronic pain of left knee 01/23/2018   RUQ pain 03/14/2017   Atrophic vaginitis 05/30/2014   Pre-diabetes 05/30/2014   Vitamin D deficiency 05/30/2014   Obesity (BMI 30.0-34.9) 05/26/2014   Hypothyroidism 12/04/2011   Hypertension 11/04/2011   Past Medical History:  Diagnosis Date   Complication of anesthesia    hard to wake up   Hypertension    Hypothyroidism    Family History  Problem Relation Age of Onset   Hypertension Mother    Hypertension Father    Emphysema Father    Heart attack Brother    Breast cancer Neg Hx    Past Surgical History:  Procedure Laterality Date   APPENDECTOMY  2014   LAPAROSCOPIC APPENDECTOMY N/A 11/30/2013   Procedure: APPENDECTOMY LAPAROSCOPIC;  Surgeon: Odis Hollingshead, MD;  Location: [facility];  Service: General;  Laterality: N/A;   TUBAL LIGATION     Social History   Occupational History   Not on file  Tobacco Use   Smoking status:  Former   Smokeless tobacco: Never   Tobacco comments:    quit 40+ years ago  Vaping Use   Vaping Use: Never used  Substance and Sexual Activity   Alcohol use: Yes    Comment: occasional wine   Drug use: Never   Sexual activity: Yes    Partners: Male    Birth control/protection: Post-menopausal    Comment: husband   I spent 52 minutes in the care of the patient today including face-to-face time, preparation to see the patient, as well as independent review of previous x-rays, note review from PCP and other providers, counseling and educating the patient on home exercise plan, holistic treatment options, custom bracing options, and typing out instructions for knee osteoarthritis treatment plan for the above diagnoses.   [name], DO Primary Care Sports Medicine Physician  [place]  This note was dictated using Dragon naturally speaking software and may contain errors in syntax, spelling, or content which have not been identified prior to signing this note.

## 2022-12-02 NOTE — Progress Notes (Signed)
Chronic left knee pain  History of meniscal surgery years ago Has had injections into this knee before which do help when she gets them but she would like to know if there are other treatment options No swelling in the knee today  She takes aleve for pain PRN

## 2022-12-09 ENCOUNTER — Encounter: Payer: Self-pay | Admitting: Sports Medicine

## 2022-12-09 NOTE — Progress Notes (Signed)
This is an addendum to my prior note from 12/02/22.  I do think Sara Ward would benefit from a long-acting corticosteroid injection, I.e. Zilretta, as she has tried topical and oral anti-inflammatories such as Tylenol and Ibuprofen without much relief. She has tried nonpharmacologic therapy with lifestyle modification, home exercise or physical therapy, and attempted weight loss and exercise regimens without significant relief. They have failed long-term benefit from previous corticosteroid injection therapy - as she would get good initial relief but only for a short time period. Recently last CS-injection have lasted a shorter time. For these reasons, I do think they are an appropriate candidate for Zilretta.  We will send authorization through the insurance company, will notify patient when this is approved and we will schedule in clinic for injection therapy at this time when works for the patient.   Sara Brunner, DO Primary Care Sports Medicine Physician  [facility]  This note was dictated using Dragon naturally speaking software and may contain errors in syntax, spelling, or content which have not been identified prior to signing this note.

## 2023-01-21 ENCOUNTER — Other Ambulatory Visit: Payer: Self-pay | Admitting: Family Medicine

## 2023-01-21 DIAGNOSIS — Z780 Asymptomatic menopausal state: Secondary | ICD-10-CM

## 2023-01-21 DIAGNOSIS — Z1231 Encounter for screening mammogram for malignant neoplasm of breast: Secondary | ICD-10-CM

## 2023-01-29 ENCOUNTER — Telehealth: Payer: Self-pay | Admitting: Sports Medicine

## 2023-01-29 NOTE — Telephone Encounter (Signed)
Patient waiting on call for approval and appointment about her injection. Please leave message if no answer

## 2023-01-29 NOTE — Telephone Encounter (Signed)
I have tried multiple times reaching this patient. We cannot leave a VM due to the machine not stating who patient is. Her injection is approved, and we have the medication. She needs to be scheduled for a 30 min slot for this.

## 2023-01-30 NOTE — Telephone Encounter (Signed)
Called and spoke with patient. She is scheduled for Tuesday 02/04/23.

## 2023-02-04 ENCOUNTER — Ambulatory Visit: Payer: PPO | Admitting: Sports Medicine

## 2023-02-04 ENCOUNTER — Ambulatory Visit: Payer: Self-pay

## 2023-02-04 ENCOUNTER — Encounter: Payer: Self-pay | Admitting: Sports Medicine

## 2023-02-04 DIAGNOSIS — M1712 Unilateral primary osteoarthritis, left knee: Secondary | ICD-10-CM | POA: Diagnosis not present

## 2023-02-04 DIAGNOSIS — G8929 Other chronic pain: Secondary | ICD-10-CM | POA: Diagnosis not present

## 2023-02-04 DIAGNOSIS — M25562 Pain in left knee: Secondary | ICD-10-CM | POA: Diagnosis not present

## 2023-02-04 NOTE — Progress Notes (Signed)
   Procedure Note  Patient: Sara Ward             Date of Birth: 1956/05/03           MRN: [id number]             Visit Date: 02/04/2023  Procedures: Visit Diagnoses:  1. Unilateral primary osteoarthritis, left knee   2. Chronic pain of left knee    Large Joint Inj: L knee on 02/04/2023 5:35 PM Details: 22 G (Zilretta (triamcinolone acetonide extended-release injectable suspension) 32mg  (91mL) NDC: 63016-010-93) 1.5 in needle, ultrasound-guided superolateral approach Outcome: tolerated well, no immediate complications  Procedure: Ultrasound-guided Knee Injection, left After discussion on risk/benefits/indication, informed verbal consent was obtained. A timeout was then performed. The patient was lying in supine on examination table with a small bolster underneath the affected knee for comfort. The patient's knee was prepped with Chloraprep and alcohol swabs. Utilizing ultrasound-guidance with the probe in a transverse position, the patient's suprapatellar bursa was identified. Then, using ultrasound guidance, the knee joint was subsequently injected intraarticularly with a 20mL injectate of Zilretta (triamcinolone acetonide extended-release injectable suspension) 32mg  per vial utilizing an in-plane visualization approach. Patient tolerated the procedure well without immediate complications. A band-aid was applied.  Procedure, treatment alternatives, risks and benefits explained, specific risks discussed. Consent was given by the patient. Immediately prior to procedure a time out was called to verify the correct patient, procedure, equipment, support staff and site/side marked as required. Patient was prepped and draped in the usual sterile fashion.     - post-injection guidelines discussed with patient today - follow-up as needed - may use OTC-anti-inflammatories as needed  [name], DO [place]  This note was  dictated using Dragon naturally speaking software and may contain errors in syntax, spelling, or content which have not been identified prior to signing this note.

## 2023-02-25 IMAGING — MG MM DIGITAL SCREENING BILAT W/ TOMO AND CAD
8 series · 9 of 24 positions shown · non-contrast
Comparison: Previous exam(s).

CLINICAL DATA: Screening.

EXAM:
DIGITAL SCREENING BILATERAL MAMMOGRAM WITH TOMOSYNTHESIS AND CAD
TECHNIQUE: Bilateral screening digital craniocaudal and mediolateral oblique
mammograms were obtained. Bilateral screening digital breast
tomosynthesis was performed. The images were evaluated with
computer-aided detection.

[R MLO synth-2D]
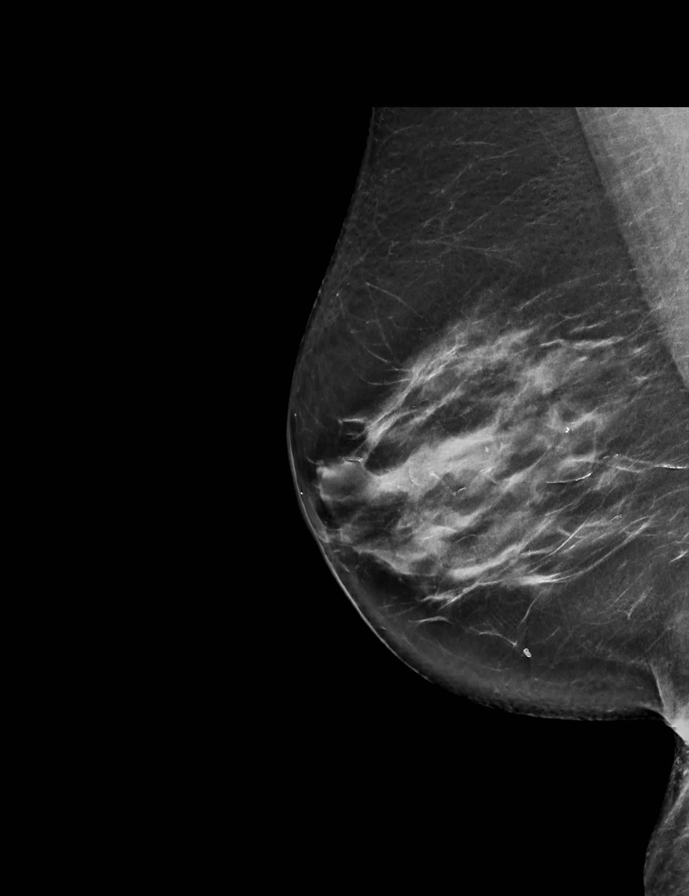

[L CC synth-2D]
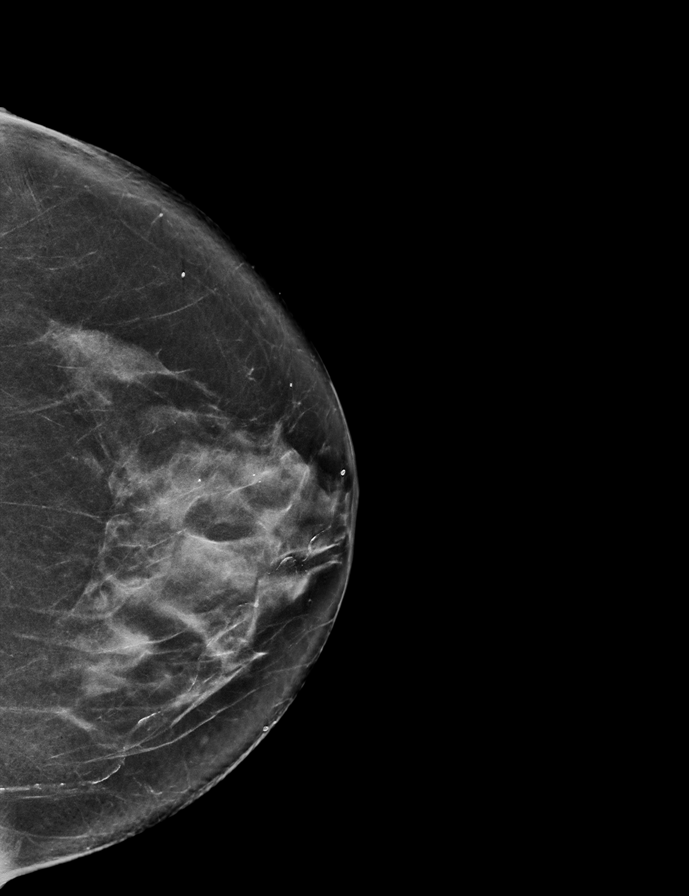

[L MLO synth-2D]
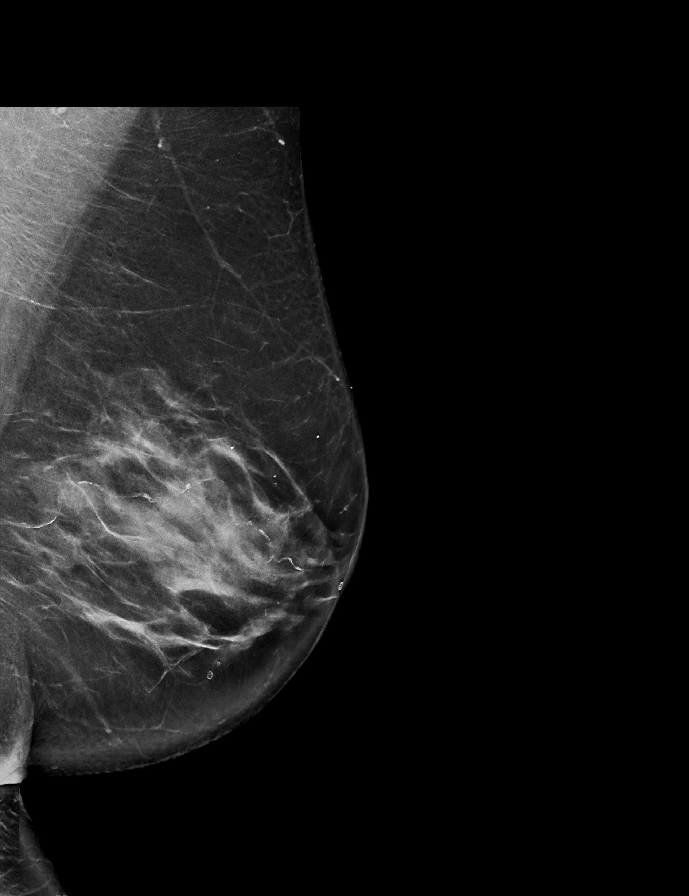

[R CC synth-2D]
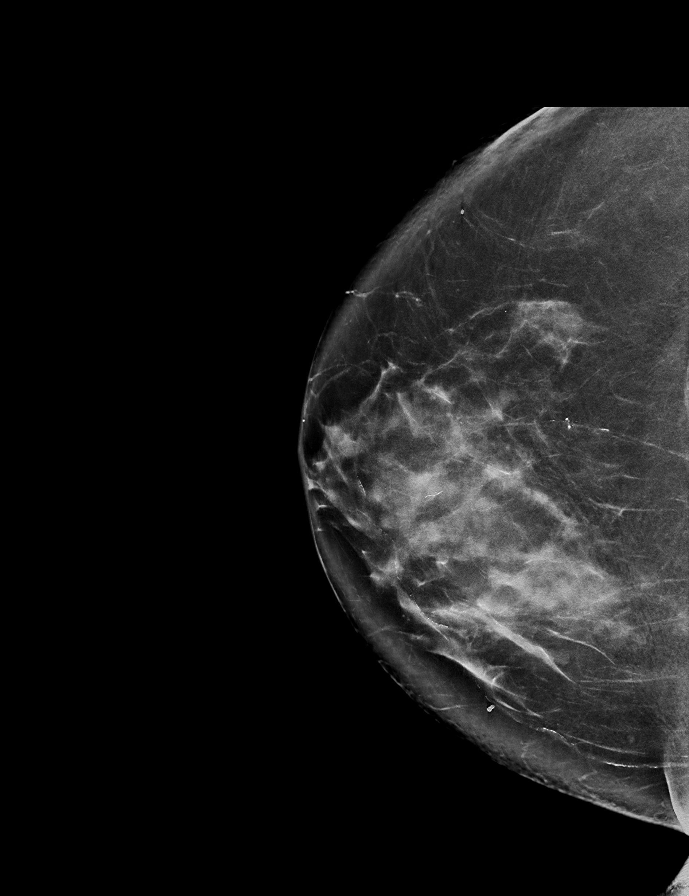

[L CC tomo · 2 of 73 frames shown]
[frame 24/73]
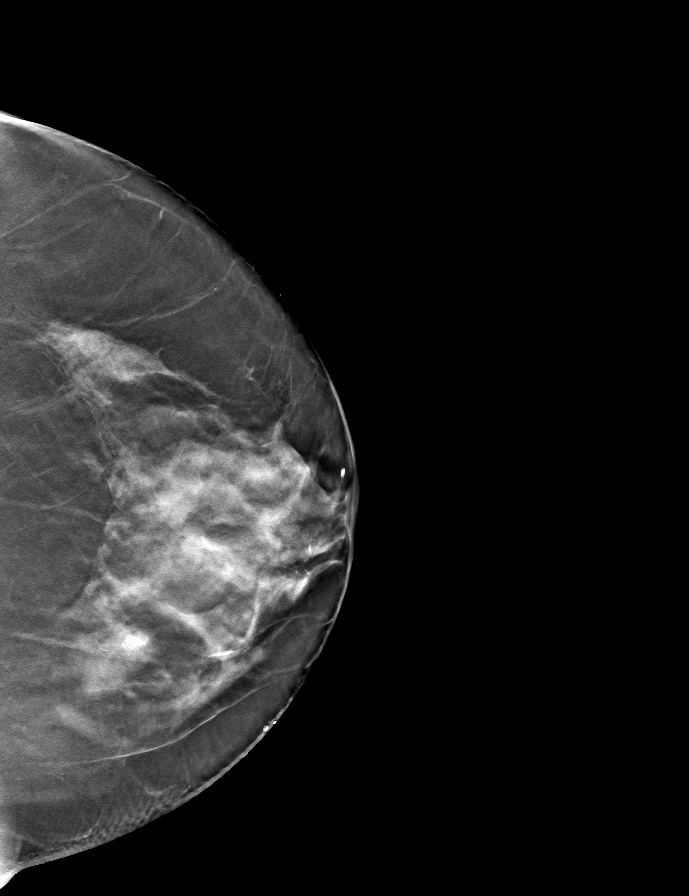
[frame 37/73]
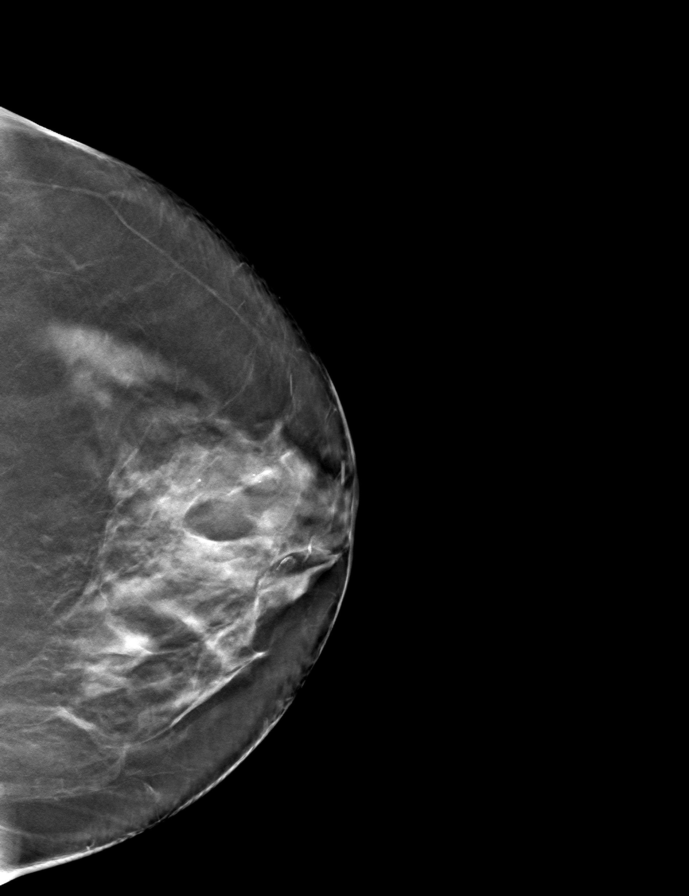

[R MLO tomo · tomo slice 41/82.0]
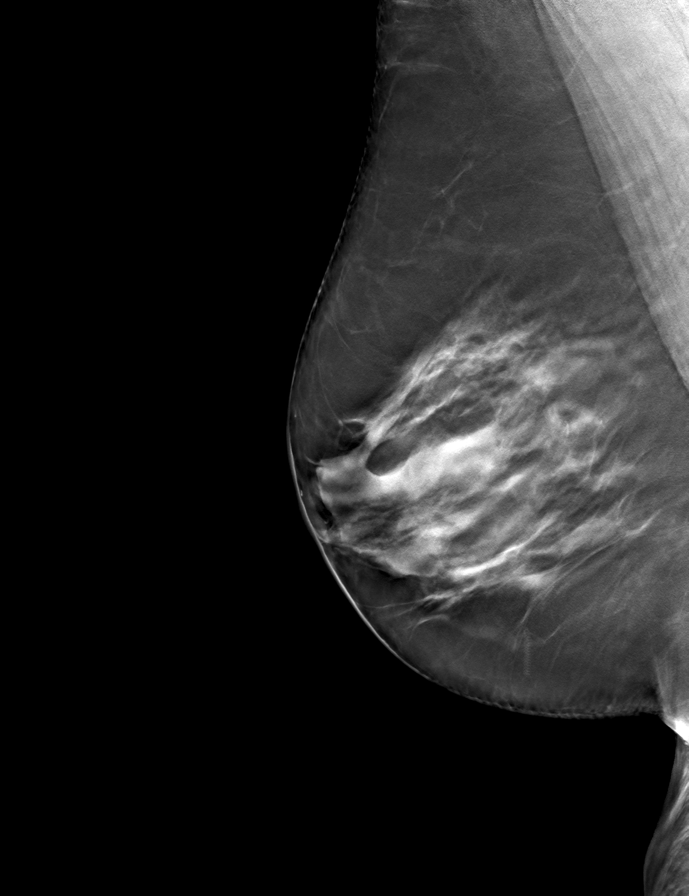

[R CC tomo · tomo slice 43/84.0]
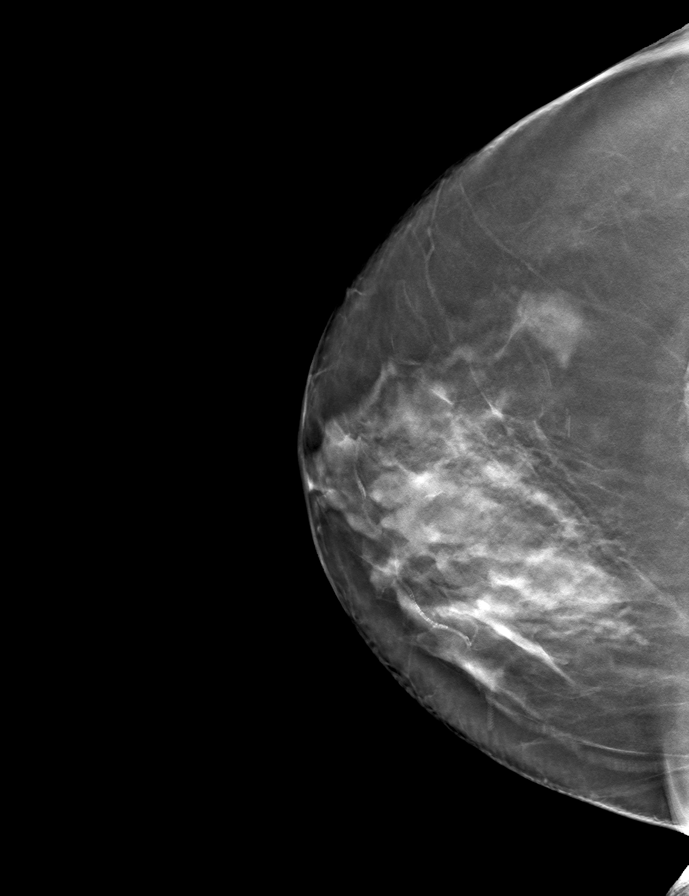

[L MLO tomo · tomo slice 41/80.0]
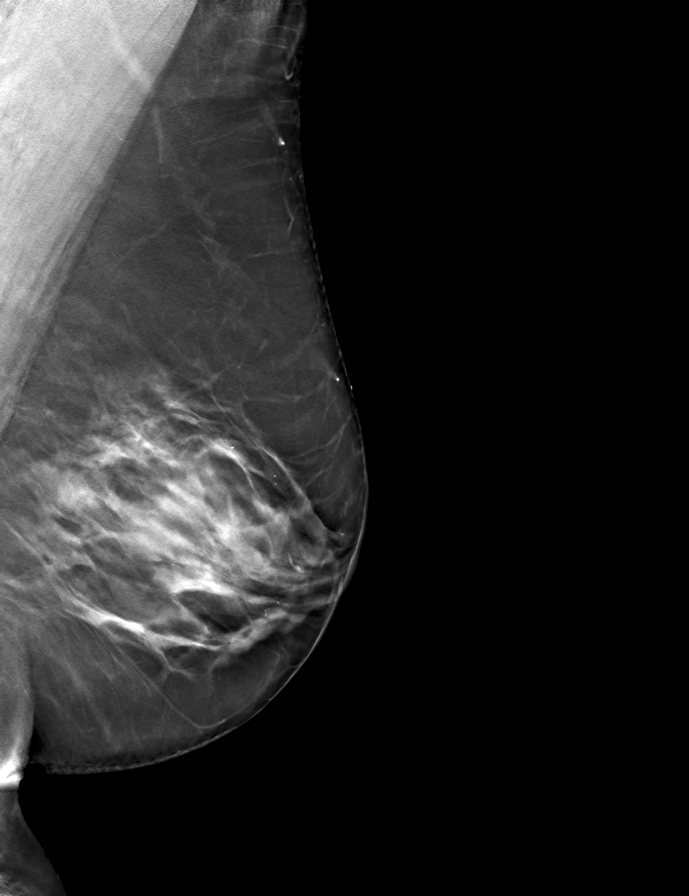

[9 of 24 positions shown; findings below may reference images not displayed]

ACR Breast Density Category c: The breast tissue is heterogeneously
dense, which may obscure small masses.
FINDINGS: There are no findings suspicious for malignancy.
IMPRESSION: No mammographic evidence of malignancy. A result letter of this
screening mammogram will be mailed directly to the patient.

RECOMMENDATION:
Screening mammogram in one year. (Code:Q3-W-BC3)

BI-RADS CATEGORY  1: Negative.

## 2023-03-12 ENCOUNTER — Ambulatory Visit
Admission: RE | Admit: 2023-03-12 | Discharge: 2023-03-12 | Disposition: A | Discharge status: Home / Self Care | Payer: PPO | Source: Ambulatory Visit

## 2023-03-12 DIAGNOSIS — Z1231 Encounter for screening mammogram for malignant neoplasm of breast: Secondary | ICD-10-CM

## 2023-03-14 ENCOUNTER — Other Ambulatory Visit: Payer: Self-pay | Admitting: Family

## 2023-03-14 DIAGNOSIS — R928 Other abnormal and inconclusive findings on diagnostic imaging of breast: Secondary | ICD-10-CM

## 2023-03-26 ENCOUNTER — Ambulatory Visit
Admission: RE | Admit: 2023-03-26 | Discharge: 2023-03-26 | Disposition: A | Discharge status: Home / Self Care | Payer: PPO | Source: Ambulatory Visit | Attending: Family | Admitting: Family

## 2023-03-26 DIAGNOSIS — R921 Mammographic calcification found on diagnostic imaging of breast: Secondary | ICD-10-CM | POA: Diagnosis not present

## 2023-03-26 DIAGNOSIS — R928 Other abnormal and inconclusive findings on diagnostic imaging of breast: Secondary | ICD-10-CM

## 2023-04-15 ENCOUNTER — Other Ambulatory Visit: Payer: Self-pay | Admitting: Family

## 2023-04-15 DIAGNOSIS — E038 Other specified hypothyroidism: Secondary | ICD-10-CM

## 2023-04-23 ENCOUNTER — Ambulatory Visit (INDEPENDENT_AMBULATORY_CARE_PROVIDER_SITE_OTHER): Payer: PPO | Admitting: Sports Medicine

## 2023-04-23 ENCOUNTER — Encounter: Payer: Self-pay | Admitting: Sports Medicine

## 2023-04-23 DIAGNOSIS — G5792 Unspecified mononeuropathy of left lower limb: Secondary | ICD-10-CM

## 2023-04-23 DIAGNOSIS — M1712 Unilateral primary osteoarthritis, left knee: Secondary | ICD-10-CM | POA: Diagnosis not present

## 2023-04-23 DIAGNOSIS — G5732 Lesion of lateral popliteal nerve, left lower limb: Secondary | ICD-10-CM | POA: Diagnosis not present

## 2023-04-23 MED ORDER — MELOXICAM 15 MG PO TABS: 15.0000 mg | ORAL_TABLET | Freq: Every day | ORAL | 1 refills | Status: DC

## 2023-04-23 NOTE — Progress Notes (Signed)
Left knee follow up  Knee doing ok; is very achy Pain increases when she Is on her feet a lot    Complaining of a knot on the top of the left foot Not painful Just itchy

## 2023-04-23 NOTE — Progress Notes (Signed)
Sara Ward - 67 y.o. female MRN [id number]  Date of birth: 1956/12/07  Office Visit Note: Visit Date: 04/23/2023 PCP: Caesar Bookman, NP Referred by: Caesar Bookman, NP  Subjective: Chief Complaint  Patient presents with   Left Knee - Follow-up   HPI: Sara Ward is a pleasant 67 y.o. female who presents today for acute on chronic left knee pain; also with some itchiness and tingling of the LLE.  Has medial joint osteoarthritis.  We did proceed with extended release Zilretta injection about 2.5 months ago.  Was feeling excellent after having this, recently her pain has started to increase.  Worse with prolonged ambulation or standing on her feet.  History of meniscal repair about 4-5 years ago for the left knee, performed by Dr. Thomasena Edis.  MRI reviewed from 2019 which showed medial meniscal tear.  Noticed some mild swelling and itching over the dorsum of the foot right over the anterior ankle joint.  Has been wearing a different pair of 'On' shoes that she notes.  It is no longer itching.  At times is feeling a weird nerve like pain down the leg and ankle.  Pertinent ROS were reviewed with the patient and found to be negative unless otherwise specified above in HPI.   Assessment & Plan: Visit Diagnoses:  1. Unilateral primary osteoarthritis, left knee   2. Entrapment of left deep peroneal nerve   3. Neuropathy of left lower extremity    Plan: Discussed with Naiyana the nature of her left knee pain which seems indicative of her moderate to severe medial joint osteoarthritis.  Has had good pain improvement initially with corticosteroid injection, although pain starting to increase here the last few weeks.  She had some nerve related issues of the leg, I am not sure the exact cause of the upper leg, but it does seem with her shoewear at the ankle joint line that she could be dealing with a deep peroneal nerve transient neuropathy given the tightness of the shoestrings here. She does  have a small mole appearing structure here but no surrounding redness or effusion.  Discussed shoewear modification and avoiding time to shoestrings tightly over this area.  In terms of the knee osteoarthritis, we will get her started on meloxicam 15 mg to be taken once daily.  Encouraged healthy weight loss, appropriate physical activity, home exercises.  Can continue infrequent corticosteroid injection, although will hold today.  Additional treatment considerations for the knee would be MRI to better quantify the degree of cartilage loss.   May consider green sports insoles with scaphoid pad to help correct pes planus and offload the medial knee joint.  Follow-up: Return in about 1 month (around 05/24/2023) for left knee pain.   Meds & Orders:  Meds ordered this encounter  Medications   meloxicam (MOBIC) 15 MG tablet    Sig: Take 1 tablet (15 mg total) by mouth daily.    Dispense:  30 tablet    Refill:  1   No orders of the defined types were placed in this encounter.    Procedures: No procedures performed      Clinical History: No specialty comments available.  She reports that she has quit smoking. She has never used smokeless tobacco. No results for input(s): "HGBA1C", "LABURIC" in the last 8760 hours.  Objective:   Vital Signs: There were no vitals taken for this visit.  Physical Exam  Gen: Well-appearing, in no acute distress; non-toxic CV:  Well-perfused. Warm.  Resp: Breathing unlabored on room air; no wheezing. Psych: Fluid speech in conversation; appropriate affect; normal thought process Neuro: Sensation intact throughout. No gross coordination deficits.   Ortho Exam - Left knee: Trace effusion, no redness or warmth.  There is limitation in range of motion from about 5-115 degrees.  I am unable to take her further and extension passively, there is pain with endrange flexion.  No varus or valgus instability.  - Left ankle/foot: There is a very small nodule over the  anterior aspect of the ankle joint line, no redness or warmth.  Negative Tinel's at this location.  Full range of motion about the ankle.  There is moderate pes planus bilaterally, left greater than right.  Imaging: - Knee Xray L 12/02/22:  2 views of the left knee including bilateral standing AP and Zoila Shutter  views were ordered and reviewed by myself.  X-rays demonstrate mild  tricompartmental arthritic change, with moderate to severe medial joint  space narrowing noted on Zoila Shutter view of the left medial knee.  Mild  arthritic change of the right knee.  There is bilateral lateral tibial  plateau spurring.  No acute fracture noted. There is a mild valgus shift  of the left knee on standing.   Past Medical/Family/Surgical/Social History: Medications & Allergies reviewed per EMR, new medications updated. Patient Active Problem List   Diagnosis Date Noted   Healthcare maintenance 05/01/2021   Insomnia 03/26/2021   GERD (gastroesophageal reflux disease) 10/11/2019   Joint pain 05/31/2019   Hyperlipidemia 05/31/2019   Chronic pain of left knee 01/23/2018   RUQ pain 03/14/2017   Atrophic vaginitis 05/30/2014   Pre-diabetes 05/30/2014   Vitamin D deficiency 05/30/2014   Obesity (BMI 30.0-34.9) 05/26/2014   Hypothyroidism 12/04/2011   Hypertension 11/04/2011   Past Medical History:  Diagnosis Date   Complication of anesthesia    hard to wake up   Hypertension    Hypothyroidism    Family History  Problem Relation Age of Onset   Hypertension Mother    Hypertension Father    Emphysema Father    Heart attack Brother    Breast cancer Neg Hx    Past Surgical History:  Procedure Laterality Date   APPENDECTOMY  2014   LAPAROSCOPIC APPENDECTOMY N/A 11/30/2013   Procedure: APPENDECTOMY LAPAROSCOPIC;  Surgeon: Adolph Pollack, MD;  Location: [facility];  Service: General;  Laterality: N/A;   TUBAL LIGATION     Social History   Occupational History   Not on file  Tobacco Use    Smoking status: Former   Smokeless tobacco: Never   Tobacco comments:    quit 40+ years ago  Vaping Use   Vaping Use: Never used  Substance and Sexual Activity   Alcohol use: Yes    Comment: occasional wine   Drug use: Never   Sexual activity: Yes    Partners: Male    Birth control/protection: Post-menopausal    Comment: husband

## 2023-04-24 DIAGNOSIS — J329 Chronic sinusitis, unspecified: Secondary | ICD-10-CM | POA: Diagnosis not present

## 2023-04-24 DIAGNOSIS — J40 Bronchitis, not specified as acute or chronic: Secondary | ICD-10-CM | POA: Diagnosis not present

## 2023-05-08 ENCOUNTER — Ambulatory Visit (INDEPENDENT_AMBULATORY_CARE_PROVIDER_SITE_OTHER): Payer: PPO | Admitting: Family

## 2023-05-08 ENCOUNTER — Encounter: Payer: Self-pay | Admitting: Family

## 2023-05-08 VITALS — BP 136/88 | HR 83 | Temp 98.6°F | Ht 60.0 in | Wt 180.6 lb

## 2023-05-08 DIAGNOSIS — E039 Hypothyroidism, unspecified: Secondary | ICD-10-CM

## 2023-05-08 DIAGNOSIS — K219 Gastro-esophageal reflux disease without esophagitis: Secondary | ICD-10-CM | POA: Diagnosis not present

## 2023-05-08 DIAGNOSIS — E782 Mixed hyperlipidemia: Secondary | ICD-10-CM

## 2023-05-08 DIAGNOSIS — I10 Essential (primary) hypertension: Secondary | ICD-10-CM | POA: Diagnosis not present

## 2023-05-08 DIAGNOSIS — R7303 Prediabetes: Secondary | ICD-10-CM

## 2023-05-08 MED ORDER — ATORVASTATIN CALCIUM 40 MG PO TABS: 40.0000 mg | ORAL_TABLET | Freq: Every day | ORAL | 1 refills | Status: DC

## 2023-05-08 MED ORDER — LEVOTHYROXINE SODIUM 50 MCG PO TABS: 50.0000 ug | ORAL_TABLET | Freq: Every day | ORAL | 1 refills | Status: DC

## 2023-05-08 MED ORDER — MELOXICAM 15 MG PO TABS: 15.0000 mg | ORAL_TABLET | Freq: Every day | ORAL | 1 refills | Status: DC

## 2023-05-08 MED ORDER — AMLODIPINE BESYLATE 5 MG PO TABS: ORAL_TABLET | ORAL | 3 refills | Status: DC

## 2023-05-08 MED ORDER — OLMESARTAN MEDOXOMIL-HCTZ 40-25 MG PO TABS: 1.0000 | ORAL_TABLET | Freq: Every day | ORAL | 1 refills | Status: DC

## 2023-05-08 MED ORDER — ESOMEPRAZOLE MAGNESIUM 20 MG PO CPDR: DELAYED_RELEASE_CAPSULE | ORAL | 3 refills | Status: DC

## 2023-05-08 NOTE — Progress Notes (Signed)
Provider: Richarda Blade FNP-C   Mordecai Tindol, Donalee Citrin, NP  Patient Care Team: Cielo Arias, Donalee Citrin, NP as PCP - General (Family Medicine) Jean Rosenthal, NP (Inactive) as Nurse Practitioner (Gynecology)  Extended Emergency Contact Information Primary Emergency Contact: Delaysia, [place] Address: [address]          [place], [place] Home Phone: [phone] Relation: Spouse  Code Status:  Full Code  Goals of care: Advanced Directive information    11/07/2022    9:57 AM  Advanced Directives  Does Patient Have a Medical Advance Directive? No  Would patient like information on creating a medical advance directive? No - Patient declined     Chief Complaint  Patient presents with   Medical Management of Chronic Issues    Patient presents today for a 6 month follow-up   Quality Metric Gaps    AWV, DEXA scan, colonoscopy, zoster, pneumonia, COVID#3    HPI:  Pt is a 67 y.o. female seen today for 6 months follow up for medical management of chronic diseases.   She denies any acute issues this visit. Due for Shingrix vaccine but states had fast dose is due for second dose at the pharmacy.  Also states had COVID-19 vaccine in March 2024 would like to bring COVID vaccine card then update on chart.  Per chart also due for pneumonia vaccine but thinks she has already had it.will wait on her immunization records.advised to take a picture and send on Mychart then will update.  Had Cologuard done 11/25/2022 which was negative will update colonoscopy records.  Has upcoming appointment for bone density tomorrow 05/09/2023.Has high risk factors on levothyroxine for hypothyroidism,race,estrogen deficiency and postmenopausal .she on daily MVI and vitamin D supplement.   Past Medical History:  Diagnosis Date   Complication of anesthesia    hard to wake up   Hypertension    Hypothyroidism    Past Surgical History:  Procedure Laterality Date   APPENDECTOMY  2014    LAPAROSCOPIC APPENDECTOMY N/A 11/30/2013   Procedure: APPENDECTOMY LAPAROSCOPIC;  Surgeon: Adolph Pollack, MD;  Location: [facility];  Service: General;  Laterality: N/A;   TUBAL LIGATION      Allergies  Allergen Reactions   Contrast Media [Iodinated Contrast Media]     Itching and hives    Allergies as of 05/08/2023       Reactions   Contrast Media [iodinated Contrast Media]    Itching and hives        Medication List        Accurate as of May 08, 2023 10:25 AM. If you have any questions, ask your nurse or doctor.          amLODipine 5 MG tablet Commonly known as: NORVASC TAKE 1 TABLET BY MOUTH EVERY DAY   aspirin 81 MG tablet Take 1 tablet (81 mg total) by mouth daily.   atorvastatin 40 MG tablet Commonly known as: LIPITOR Take 1 tablet (40 mg total) by mouth daily.   CULTURELLE PO Take 1 capsule by mouth in the morning.   esomeprazole 20 MG capsule Commonly known as: NEXIUM TAKE 1 CAPSULE BY MOUTH EVERY DAY BEFORE breakfast   levothyroxine 50 MCG tablet Commonly known as: SYNTHROID TAKE 1 TABLET BY MOUTH EVERY DAY   meloxicam 15 MG tablet Commonly known as: MOBIC Take 1 tablet (15 mg total) by mouth daily.   MULTIVITAMIN PO Take 1 capsule by mouth as needed.   olmesartan-hydrochlorothiazide 40-25 MG  tablet Commonly known as: BENICAR HCT Take 1 tablet by mouth daily.   VITAMIN D PO Take 1 capsule by mouth as needed.        Review of Systems  Constitutional:  Negative for appetite change, chills, fatigue, fever and unexpected weight change.  HENT:  Negative for congestion, dental problem, ear discharge, ear pain, facial swelling, hearing loss, nosebleeds, postnasal drip, rhinorrhea, sinus pressure, sinus pain, sneezing, sore throat, tinnitus and trouble swallowing.   Eyes:  Negative for pain, discharge, redness, itching and visual disturbance.  Respiratory:  Negative for cough, chest tightness, shortness of breath and wheezing.    Cardiovascular:  Negative for chest pain, palpitations and leg swelling.  Gastrointestinal:  Negative for abdominal distention, abdominal pain, blood in stool, constipation, diarrhea, nausea and vomiting.  Endocrine: Negative for cold intolerance, heat intolerance, polydipsia, polyphagia and polyuria.  Genitourinary:  Negative for difficulty urinating, dysuria, flank pain, frequency and urgency.  Musculoskeletal:  Positive for arthralgias. Negative for back pain, gait problem, joint swelling, myalgias, neck pain and neck stiffness.  Skin:  Negative for color change, pallor, rash and wound.  Neurological:  Negative for dizziness, syncope, speech difficulty, weakness, light-headedness, numbness and headaches.  Hematological:  Does not bruise/bleed easily.  Psychiatric/Behavioral:  Negative for agitation, behavioral problems, confusion, hallucinations, self-injury, sleep disturbance and suicidal ideas. The patient is not nervous/anxious.     Immunization History  Administered Date(s) Administered   Influenza,inj,Quad PF,6+ Mos 01/21/2018, 10/17/2018, 10/11/2019   Influenza-Unspecified 10/17/2018, 09/17/2022   PFIZER Comirnaty(Gray Top)Covid-19 Tri-Sucrose Vaccine 05/01/2021   Pfizer Covid-19 Vaccine Bivalent Booster 55yrs & up 11/01/2021   Tdap 05/01/2021   Zoster Recombinat (Shingrix) 01/01/2022   Pertinent  Health Maintenance Due  Topic Date Due   COLONOSCOPY (Pts 45-66yrs Insurance coverage will need to be confirmed)  Never done   DEXA SCAN  10/28/2019   INFLUENZA VACCINE  07/24/2023   MAMMOGRAM  03/25/2025      01/21/2018   11:58 AM 05/31/2019    4:24 PM 10/11/2019    8:32 AM 11/07/2022    9:57 AM  Fall Risk  Falls in the past year? No 0 0 0  Was there an injury with Fall?    0  Fall Risk Category Calculator    0  Fall Risk Category (Retired)    Low  (RETIRED) Patient Fall Risk Level  Low fall risk Low fall risk Low fall risk  Patient at Risk for Falls Due to    No Fall Risks   Fall risk Follow up    Falls evaluation completed   Functional Status Survey:    Vitals:   05/08/23 1000  BP: 136/88  Pulse: 83  Temp: 98.6 F (37 C)  SpO2: 96%  Weight: 180 lb 9.6 oz (81.9 kg)  Height: 5' (1.524 m)   Body mass index is 35.27 kg/m. Physical Exam Vitals reviewed.  Constitutional:      General: She is not in acute distress.    Appearance: Normal appearance. She is obese. She is not ill-appearing or diaphoretic.  HENT:     Head: Normocephalic.     Right Ear: Tympanic membrane, ear canal and external ear normal. There is no impacted cerumen.     Left Ear: Tympanic membrane, ear canal and external ear normal. There is no impacted cerumen.     Nose: Nose normal. No congestion or rhinorrhea.     Mouth/Throat:     Mouth: Mucous membranes are moist.     Pharynx: Oropharynx is  clear. No oropharyngeal exudate or posterior oropharyngeal erythema.  Eyes:     General: No scleral icterus.       Right eye: No discharge.        Left eye: No discharge.     Extraocular Movements: Extraocular movements intact.     Conjunctiva/sclera: Conjunctivae normal.     Pupils: Pupils are equal, round, and reactive to light.  Neck:     Vascular: No carotid bruit.  Cardiovascular:     Rate and Rhythm: Normal rate and regular rhythm.     Pulses: Normal pulses.     Heart sounds: Normal heart sounds. No murmur heard.    No friction rub. No gallop.  Pulmonary:     Effort: Pulmonary effort is normal. No respiratory distress.     Breath sounds: Normal breath sounds. No wheezing, rhonchi or rales.  Chest:     Chest wall: No tenderness.  Abdominal:     General: Bowel sounds are normal. There is no distension.     Palpations: Abdomen is soft. There is no mass.     Tenderness: There is no abdominal tenderness. There is no right CVA tenderness, left CVA tenderness, guarding or rebound.  Musculoskeletal:        General: No swelling or tenderness. Normal range of motion.     Cervical  back: Normal range of motion. No rigidity or tenderness.     Right lower leg: No edema.     Left lower leg: No edema.  Lymphadenopathy:     Cervical: No cervical adenopathy.  Skin:    General: Skin is warm and dry.     Coloration: Skin is not pale.     Findings: No bruising, erythema, lesion or rash.  Neurological:     Mental Status: She is alert and oriented to person, place, and time.     Cranial Nerves: No cranial nerve deficit.     Sensory: No sensory deficit.     Motor: No weakness.     Coordination: Coordination normal.     Gait: Gait normal.  Psychiatric:        Mood and Affect: Mood normal.        Speech: Speech normal.        Behavior: Behavior normal.        Thought Content: Thought content normal.        Judgment: Judgment normal.     Labs reviewed: Recent Labs    11/13/22 0823  NA 140  K 4.1  CL 102  CO2 30  GLUCOSE 105*  BUN 20  CREATININE 0.82  CALCIUM 10.3   Recent Labs    11/13/22 0823  AST 21  ALT 24  BILITOT 0.4  PROT 7.1   Recent Labs    11/13/22 0823  WBC 6.1  NEUTROABS 3,556  HGB 13.5  HCT 40.2  MCV 91.0  PLT 263   Lab Results  Component Value Date   TSH 4.80 (H) 11/13/2022   Lab Results  Component Value Date   HGBA1C 5.8 (A) 03/26/2021   Lab Results  Component Value Date   CHOL 128 11/13/2022   HDL 57 11/13/2022   LDLCALC 57 11/13/2022   TRIG 60 11/13/2022   CHOLHDL 2.2 11/13/2022    Significant Diagnostic Results in last 30 days:  No results found.  Assessment/Plan 1. Acquired hypothyroidism Lab Results  Component Value Date   TSH 4.80 (H) 11/13/2022  Continue on levothyroxine 50 mcg daily on empty stomach - levothyroxine (  SYNTHROID) 50 MCG tablet; Take 1 tablet (50 mcg total) by mouth daily.  Dispense: 90 tablet; Refill: 1 - TSH; Future  2. Mixed hyperlipidemia Previous LDL at goal -Continue on atorvastatin -Dietary modification and exercise advised - Lipid panel; Future  3. Gastroesophageal reflux  disease without esophagitis Stable -Previous H&H stable -Continue on his  esomeprazole  4. Essential hypertension Blood pressure well-controlled -Continue on amlodipine,olmesartan - hydrochlorothiazide -Continue on aspirin and atorvastatin for cardiovascular event prevention.  -Low-sodium diet -Dietary modification and exercise at least 3 times per week for 30 minutes - olmesartan-hydrochlorothiazide (BENICAR HCT) 40-25 MG tablet; Take 1 tablet by mouth daily.  Dispense: 90 tablet; Refill: 1 - COMPLETE METABOLIC PANEL WITH GFR; Future - CBC with Differential/Platelet; Future  5. Pre-diabetes Lab Results  Component Value Date   HGBA1C 5.8 (A) 03/26/2021  Dietary modification and exercise advised as above  Family/ staff Communication: Reviewed plan of care with patient verbalized understanding  Labs/tests ordered:  - COMPLETE METABOLIC PANEL WITH GFR; Future - CBC with Differential/Platelet; Future - Lipid panel; Future - TSH; Future  Next Appointment : Return in about 6 months (around 11/08/2023) for medical mangement of chronic issues., Fasting labs in 6 months prior to visit.AWV soon.   Caesar Bookman, NP

## 2023-05-09 ENCOUNTER — Ambulatory Visit
Admission: RE | Admit: 2023-05-09 | Discharge: 2023-05-09 | Disposition: A | Discharge status: Home / Self Care | Payer: PPO | Source: Ambulatory Visit

## 2023-05-09 DIAGNOSIS — M810 Age-related osteoporosis without current pathological fracture: Secondary | ICD-10-CM | POA: Diagnosis not present

## 2023-05-09 DIAGNOSIS — N951 Menopausal and female climacteric states: Secondary | ICD-10-CM | POA: Diagnosis not present

## 2023-05-09 DIAGNOSIS — Z780 Asymptomatic menopausal state: Secondary | ICD-10-CM

## 2023-05-09 DIAGNOSIS — E2839 Other primary ovarian failure: Secondary | ICD-10-CM | POA: Diagnosis not present

## 2023-07-22 ENCOUNTER — Other Ambulatory Visit: Payer: Self-pay | Admitting: Family

## 2023-07-22 NOTE — Telephone Encounter (Signed)
Please confirm patient can have refills. Medication pend and sent to PCP Ngetich, Donalee Citrin, NP

## 2023-09-01 ENCOUNTER — Encounter: Payer: Self-pay | Admitting: Sports Medicine

## 2023-09-01 ENCOUNTER — Ambulatory Visit: Payer: PPO | Admitting: Sports Medicine

## 2023-09-01 DIAGNOSIS — M25562 Pain in left knee: Secondary | ICD-10-CM | POA: Diagnosis not present

## 2023-09-01 DIAGNOSIS — E669 Obesity, unspecified: Secondary | ICD-10-CM | POA: Diagnosis not present

## 2023-09-01 DIAGNOSIS — G8929 Other chronic pain: Secondary | ICD-10-CM | POA: Diagnosis not present

## 2023-09-01 DIAGNOSIS — M1712 Unilateral primary osteoarthritis, left knee: Secondary | ICD-10-CM | POA: Diagnosis not present

## 2023-09-01 MED ORDER — BUPIVACAINE HCL 0.25 % IJ SOLN
2.0000 mL | INTRAMUSCULAR | Status: AC | PRN
  Administered 2023-09-01: 2 mL via INTRA_ARTICULAR

## 2023-09-01 MED ORDER — LIDOCAINE HCL 1 % IJ SOLN
2.0000 mL | INTRAMUSCULAR | Status: AC | PRN
  Administered 2023-09-01: 2 mL

## 2023-09-01 MED ORDER — MELOXICAM 15 MG PO TABS: 15.0000 mg | ORAL_TABLET | Freq: Every day | ORAL | 1 refills | Status: DC | PRN

## 2023-09-01 MED ORDER — METHYLPREDNISOLONE ACETATE 40 MG/ML IJ SUSP
80.0000 mg | INTRAMUSCULAR | Status: AC | PRN
  Administered 2023-09-01: 80 mg via INTRA_ARTICULAR

## 2023-09-01 NOTE — Progress Notes (Signed)
L Knee injection Not diabetic

## 2023-09-01 NOTE — Progress Notes (Signed)
Sara Ward - 67 y.o. female MRN [id number]  Date of birth: February 28, 1956  Office Visit Note: Visit Date: 09/01/2023 PCP: Caesar Bookman, NP Referred by: Caesar Bookman, NP  Subjective: Chief Complaint  Patient presents with   Left Knee - Pain   HPI: Sara Ward is a pleasant 67 y.o. female who presents today for acute on chronic left knee pain with knee OA.  She is having an exacerbation of her left underlying knee arthritis.  We did perform an injection back in February and has been doing well.  Recently her and her husband went to [facility] and walking on it hard for walking on the beach with of her knee.  She has some swelling in the knee but stays consistently like this.  She does have some home exercises she has done for the knee but does admit she has not gotten as much recently.  Occasionally will take meloxicam as needed but currently does not have this medication.  She is wearing good supportive shoe wear using Alitzel Cookson shoes and On shoes.  Her husband Fayrene Fearing) is over a year from his knee replacement and still having difficulties.  She is not interested in knee replacement at this time, which is very reasonable.  Pertinent ROS were reviewed with the patient and found to be negative unless otherwise specified above in HPI.   Assessment & Plan: Visit Diagnoses:  1. Unilateral primary osteoarthritis, left knee   2. Chronic pain of left knee   3. Obesity (BMI 30.0-34.9)    Plan: Aashi is dealing with an exacerbation of her chronic underlying knee osteoarthritis.  She has advanced medial tibiofemoral osteoarthritis.  Through shared decision making, we did proceed with knee corticosteroid injection, patient tolerated well.  Advised 48 hours of modified rest and activity.  She may use ice as well to help with the swelling and pain.  Starting on Wednesday/Thursday morning I would like her to return back to doing her home knee rehab exercises and stabilization.  We did discuss  stretching and range of motion as well and attempt to prevent any further loss of her knee ROM.  She will then continue walking and staying active and working on maintaining good healthy weight loss as able.  We did refill her meloxicam, 15 mg.  She will take this daily as needed, is aware she is not to take this consistently long-term.  She will follow-up about 3 months, may call or return sooner if any issues arise.  Follow-up: Return in about 3 months (around 12/01/2023) for L-knee.   Meds & Orders:  Meds ordered this encounter  Medications   meloxicam (MOBIC) 15 MG tablet    Sig: Take 1 tablet (15 mg total) by mouth daily as needed for pain.    Dispense:  30 tablet    Refill:  1    Orders Placed This Encounter  Procedures   Large Joint Inj: L knee     Procedures: Large Joint Inj: L knee on 09/01/2023 2:22 PM Indications: pain and joint swelling Details: 22 G 1.5 in needle, anteromedial approach Medications: 2 mL lidocaine 1 %; 2 mL bupivacaine 0.25 %; 80 mg methylPREDNISolone acetate 40 MG/ML Outcome: tolerated well, no immediate complications  Knee Injection, Left: After discussion on risks/benefits/indications, informed verbal consent was obtained and a timeout was performed, patient was seated on exam table. The patient's knee was prepped with Betadine and alcohol swab and utilizing anteromedial approach, the patient's knee was injected intraarticularly  with 2:2:2 lidocaine 1%:bupivicaine 0.25%:depomedrol. Patient tolerated the procedure well without immediate complications.  Procedure, treatment alternatives, risks and benefits explained, specific risks discussed. Consent was given by the patient. Immediately prior to procedure a time out was called to verify the correct patient, procedure, equipment, support staff and site/side marked as required. Patient was prepped and draped in the usual sterile fashion.          Clinical History: No specialty comments available.  She  reports that she has quit smoking. She has never used smokeless tobacco. No results for input(s): "HGBA1C", "LABURIC" in the last 8760 hours.  Objective:    Physical Exam  Gen: Well-appearing, in no acute distress; non-toxic CV: Well-perfused. Warm.  Resp: Breathing unlabored on room air; no wheezing. Psych: Fluid speech in conversation; appropriate affect; normal thought process Neuro: Sensation intact throughout. No gross coordination deficits.   Ortho Exam - Left knee: There is a trace to small effusion on the knee without warmth or redness.  Medial greater than lateral joint line TTP.  Range of motion is limited from about 7-115 degrees.  There is no varus or valgus instability.  There is a mildly guarded gait on the left knee with ambulation.  5/5 strength with knee flexion and extension although pain at endrange.  Imaging:  - L knee XR 12/02/22: 2 views of the left knee including bilateral standing AP and Zoila Shutter  views were ordered and reviewed by myself.  X-rays demonstrate mild  tricompartmental arthritic change, with moderate to severe medial joint  space narrowing noted on Zoila Shutter view of the left medial knee.  Mild  arthritic change of the right knee.  There is bilateral lateral tibial  plateau spurring.  No acute fracture noted. There is a mild valgus shift  of the left knee on standing.   Past Medical/Family/Surgical/Social History: Medications & Allergies reviewed per EMR, new medications updated. Patient Active Problem List   Diagnosis Date Noted   Healthcare maintenance 05/01/2021   Insomnia 03/26/2021   GERD (gastroesophageal reflux disease) 10/11/2019   Joint pain 05/31/2019   Hyperlipidemia 05/31/2019   Chronic pain of left knee 01/23/2018   RUQ pain 03/14/2017   Atrophic vaginitis 05/30/2014   Pre-diabetes 05/30/2014   Vitamin D deficiency 05/30/2014   Obesity (BMI 30.0-34.9) 05/26/2014   Hypothyroidism 12/04/2011   Essential hypertension 11/04/2011    Past Medical History:  Diagnosis Date   Complication of anesthesia    hard to wake up   Hypertension    Hypothyroidism    Family History  Problem Relation Age of Onset   Hypertension Mother    Hypertension Father    Emphysema Father    Heart attack Brother    Breast cancer Neg Hx    Past Surgical History:  Procedure Laterality Date   APPENDECTOMY  2014   LAPAROSCOPIC APPENDECTOMY N/A 11/30/2013   Procedure: APPENDECTOMY LAPAROSCOPIC;  Surgeon: Adolph Pollack, MD;  Location: [facility];  Service: General;  Laterality: N/A;   TUBAL LIGATION     Social History   Occupational History   Not on file  Tobacco Use   Smoking status: Former   Smokeless tobacco: Never   Tobacco comments:    quit 40+ years ago  Vaping Use   Vaping status: Never Used  Substance and Sexual Activity   Alcohol use: Yes    Comment: occasional wine   Drug use: Never   Sexual activity: Yes    Partners: Male    Birth control/protection: Post-menopausal  Comment: husband

## 2023-10-02 ENCOUNTER — Other Ambulatory Visit: Payer: Self-pay | Admitting: Family

## 2023-11-10 ENCOUNTER — Other Ambulatory Visit: Payer: Self-pay

## 2023-11-10 ENCOUNTER — Encounter: Payer: Self-pay | Admitting: Family

## 2023-11-10 ENCOUNTER — Ambulatory Visit (INDEPENDENT_AMBULATORY_CARE_PROVIDER_SITE_OTHER): Payer: PPO | Admitting: Family

## 2023-11-10 VITALS — BP 130/70 | HR 73 | Temp 97.6°F | Resp 20 | Ht 60.0 in | Wt 174.0 lb

## 2023-11-10 DIAGNOSIS — K219 Gastro-esophageal reflux disease without esophagitis: Secondary | ICD-10-CM | POA: Diagnosis not present

## 2023-11-10 DIAGNOSIS — E782 Mixed hyperlipidemia: Secondary | ICD-10-CM

## 2023-11-10 DIAGNOSIS — E039 Hypothyroidism, unspecified: Secondary | ICD-10-CM | POA: Diagnosis not present

## 2023-11-10 DIAGNOSIS — I10 Essential (primary) hypertension: Secondary | ICD-10-CM

## 2023-11-10 DIAGNOSIS — R7303 Prediabetes: Secondary | ICD-10-CM | POA: Diagnosis not present

## 2023-11-10 MED ORDER — OLMESARTAN MEDOXOMIL-HCTZ 40-25 MG PO TABS: 1.0000 | ORAL_TABLET | Freq: Every day | ORAL | 1 refills | Status: DC

## 2023-11-10 MED ORDER — ATORVASTATIN CALCIUM 40 MG PO TABS: 40.0000 mg | ORAL_TABLET | Freq: Every day | ORAL | 1 refills | Status: DC

## 2023-11-10 MED ORDER — AMLODIPINE BESYLATE 5 MG PO TABS: ORAL_TABLET | ORAL | 1 refills | Status: DC

## 2023-11-10 MED ORDER — LEVOTHYROXINE SODIUM 50 MCG PO TABS: 50.0000 ug | ORAL_TABLET | Freq: Every day | ORAL | 1 refills | Status: DC

## 2023-11-10 NOTE — Progress Notes (Signed)
Provider: Richarda Blade FNP-C   Jaelynn Currier, Donalee Citrin, NP  Patient Care Team: Felipe Cabell, Donalee Citrin, NP as PCP - General (Family Medicine) Jean Rosenthal, NP (Inactive) as Nurse Practitioner (Gynecology)  Extended Emergency Contact Information Primary Emergency Contact: Marvie, Basom Address: [address]          [place], [place] Home Phone: [phone] Relation: Spouse  Code Status:  Full Code  Goals of care: Advanced Directive information    11/07/2022    9:57 AM  Advanced Directives  Does Patient Have a Medical Advance Directive? No  Would patient like information on creating a medical advance directive? No - Patient declined     Chief Complaint  Patient presents with   Medical Management of Chronic Issues    HPI:  Pt is a 67 y.o. female seen today for 6 months follow up for medical management of chronic diseases.    Hypertension - No home B/p readings for review.she denies any headache,dizziness,vision changes,fatigue,chest tightness,palpitation,chest pain or shortness of breath.     Prediabetes - has been cutting down on sweets and limits carbohydrates to twice per day. Exercise 3-4 times per week at the [facility] and also has home equipment. Has had 7 lbs weight loss.congratulated for losing weight.   Hyperlipidemia - on Lipitor no side effects reported.Has changed diet as above.   GERD -  states symptoms controlled as long as she takes her Nexium.Has taken Nexium for a long time.discussed weaning off trial if symptoms recurs to restart.  Hypothyroidism - on levothyroxine 50 mcg tablet takes on empty stomach in the morning.   Past Medical History:  Diagnosis Date   Complication of anesthesia    hard to wake up   Hypertension    Hypothyroidism    Past Surgical History:  Procedure Laterality Date   APPENDECTOMY  2014   LAPAROSCOPIC APPENDECTOMY N/A 11/30/2013   Procedure: APPENDECTOMY LAPAROSCOPIC;  Surgeon: Adolph Pollack, MD;   Location: [facility];  Service: General;  Laterality: N/A;   TUBAL LIGATION      Allergies  Allergen Reactions   Contrast Media [Iodinated Contrast Media]     Itching and hives    Allergies as of 11/10/2023       Reactions   Contrast Media [iodinated Contrast Media]    Itching and hives        Medication List        Accurate as of November 10, 2023  9:15 AM. If you have any questions, ask your nurse or doctor.          amLODipine 5 MG tablet Commonly known as: NORVASC TAKE 1 TABLET BY MOUTH EVERY DAY   aspirin 81 MG tablet Take 1 tablet (81 mg total) by mouth daily.   atorvastatin 40 MG tablet Commonly known as: LIPITOR Take 1 tablet (40 mg total) by mouth daily.   CULTURELLE PO Take 1 capsule by mouth in the morning.   esomeprazole 20 MG capsule Commonly known as: NEXIUM TAKE 1 CAPSULE BY MOUTH EVERY DAY BEFORE breakfast   levothyroxine 50 MCG tablet Commonly known as: SYNTHROID Take 1 tablet (50 mcg total) by mouth daily.   meloxicam 15 MG tablet Commonly known as: MOBIC Take 1 tablet (15 mg total) by mouth daily as needed for pain.   MULTIVITAMIN PO Take 1 capsule by mouth as needed.   olmesartan-hydrochlorothiazide 40-25 MG tablet Commonly known as: BENICAR HCT Take 1 tablet by mouth daily.   VITAMIN  D PO Take 1 capsule by mouth as needed.        Review of Systems  Constitutional:  Negative for appetite change, chills, fatigue, fever and unexpected weight change.  HENT:  Negative for congestion, dental problem, ear discharge, ear pain, facial swelling, hearing loss, nosebleeds, postnasal drip, rhinorrhea, sinus pressure, sinus pain, sneezing, sore throat, tinnitus and trouble swallowing.   Eyes:  Negative for pain, discharge, redness, itching and visual disturbance.  Respiratory:  Negative for cough, chest tightness, shortness of breath and wheezing.   Cardiovascular:  Negative for chest pain, palpitations and leg swelling.  Gastrointestinal:   Negative for abdominal distention, abdominal pain, blood in stool, constipation, diarrhea, nausea and vomiting.  Endocrine: Negative for cold intolerance, heat intolerance, polydipsia, polyphagia and polyuria.  Genitourinary:  Negative for difficulty urinating, dysuria, flank pain, frequency and urgency.  Musculoskeletal:  Positive for arthralgias. Negative for back pain, gait problem, joint swelling, myalgias, neck pain and neck stiffness.       Left knee pain   Skin:  Negative for color change, pallor, rash and wound.  Neurological:  Negative for dizziness, syncope, speech difficulty, weakness, light-headedness, numbness and headaches.  Hematological:  Does not bruise/bleed easily.  Psychiatric/Behavioral:  Negative for agitation, behavioral problems, confusion, hallucinations, self-injury, sleep disturbance and suicidal ideas. The patient is not nervous/anxious.     Immunization History  Administered Date(s) Administered   Fluad Quad(high Dose 65+) 09/30/2023   Influenza,inj,Quad PF,6+ Mos 01/21/2018, 10/17/2018, 10/11/2019   Influenza-Unspecified 10/17/2018, 09/17/2022   PFIZER Comirnaty(Gray Top)Covid-19 Tri-Sucrose Vaccine 05/01/2021, 09/30/2023   Pfizer Covid-19 Vaccine Bivalent Booster 11yrs & up 11/01/2021   Tdap 05/01/2021   Zoster Recombinant(Shingrix) 01/01/2022   Pertinent  Health Maintenance Due  Topic Date Due   MAMMOGRAM  03/25/2025   Colonoscopy  11/25/2032   INFLUENZA VACCINE  Completed   DEXA SCAN  Completed      01/21/2018   11:58 AM 05/31/2019    4:24 PM 10/11/2019    8:32 AM 11/07/2022    9:57 AM  Fall Risk  Falls in the past year? No 0 0 0  Was there an injury with Fall?    0  Fall Risk Category Calculator    0  Fall Risk Category (Retired)    Low  (RETIRED) Patient Fall Risk Level  Low fall risk Low fall risk Low fall risk  Patient at Risk for Falls Due to    No Fall Risks  Fall risk Follow up    Falls evaluation completed   Functional Status Survey:     Vitals:   11/10/23 0859  BP: 130/70  Pulse: 73  Resp: 20  Temp: 97.6 F (36.4 C)  SpO2: 97%  Weight: 174 lb (78.9 kg)  Height: 5' (1.524 m)   Body mass index is 33.98 kg/m. Physical Exam Vitals reviewed.  Constitutional:      General: She is not in acute distress.    Appearance: Normal appearance. She is obese. She is not ill-appearing or diaphoretic.  HENT:     Head: Normocephalic.     Right Ear: Tympanic membrane, ear canal and external ear normal. There is no impacted cerumen.     Left Ear: Tympanic membrane, ear canal and external ear normal. There is no impacted cerumen.     Nose: Nose normal. No congestion or rhinorrhea.     Mouth/Throat:     Mouth: Mucous membranes are moist.     Pharynx: Oropharynx is clear. No oropharyngeal exudate or posterior oropharyngeal  erythema.  Eyes:     General: No scleral icterus.       Right eye: No discharge.        Left eye: No discharge.     Extraocular Movements: Extraocular movements intact.     Conjunctiva/sclera: Conjunctivae normal.     Pupils: Pupils are equal, round, and reactive to light.  Neck:     Vascular: No carotid bruit.  Cardiovascular:     Rate and Rhythm: Normal rate and regular rhythm.     Pulses: Normal pulses.     Heart sounds: Normal heart sounds. No murmur heard.    No friction rub. No gallop.  Pulmonary:     Effort: Pulmonary effort is normal. No respiratory distress.     Breath sounds: Normal breath sounds. No wheezing, rhonchi or rales.  Chest:     Chest wall: No tenderness.  Abdominal:     General: Bowel sounds are normal. There is no distension.     Palpations: Abdomen is soft. There is no mass.     Tenderness: There is no abdominal tenderness. There is no right CVA tenderness, left CVA tenderness, guarding or rebound.  Musculoskeletal:        General: No swelling or tenderness. Normal range of motion.     Cervical back: Normal range of motion. No rigidity or tenderness.     Right knee: Crepitus  present. No swelling, effusion or erythema. Normal range of motion. No tenderness.     Left knee: Normal.     Right lower leg: No edema.     Left lower leg: No edema.  Lymphadenopathy:     Cervical: No cervical adenopathy.  Skin:    General: Skin is warm and dry.     Coloration: Skin is not pale.     Findings: No bruising, erythema, lesion or rash.  Neurological:     Mental Status: She is alert and oriented to person, place, and time.     Cranial Nerves: No cranial nerve deficit.     Sensory: No sensory deficit.     Motor: No weakness.     Coordination: Coordination normal.     Gait: Gait normal.  Psychiatric:        Mood and Affect: Mood normal.        Speech: Speech normal.        Behavior: Behavior normal.        Thought Content: Thought content normal.        Judgment: Judgment normal.     Labs reviewed: Recent Labs    11/13/22 0823  NA 140  K 4.1  CL 102  CO2 30  GLUCOSE 105*  BUN 20  CREATININE 0.82  CALCIUM 10.3   Recent Labs    11/13/22 0823  AST 21  ALT 24  BILITOT 0.4  PROT 7.1   Recent Labs    11/13/22 0823  WBC 6.1  NEUTROABS 3,556  HGB 13.5  HCT 40.2  MCV 91.0  PLT 263   Lab Results  Component Value Date   TSH 4.80 (H) 11/13/2022   Lab Results  Component Value Date   HGBA1C 5.8 (A) 03/26/2021   Lab Results  Component Value Date   CHOL 128 11/13/2022   HDL 57 11/13/2022   LDLCALC 57 11/13/2022   TRIG 60 11/13/2022   CHOLHDL 2.2 11/13/2022    Significant Diagnostic Results in last 30 days:  No results found.  Assessment/Plan 1. Essential hypertension B/p well controlled.would like to  stop amlodipine. - continue on Benicar  - will stop amlodipine then check B/p at home if > 140/90 to restart amlodipine.  - continue with dietary modification and exercise - amLODipine (NORVASC) 5 MG tablet; TAKE 1 TABLET BY MOUTH EVERY DAY  Dispense: 90 tablet; Refill: 1 - olmesartan-hydrochlorothiazide (BENICAR HCT) 40-25 MG tablet; Take 1  tablet by mouth daily.  Dispense: 90 tablet; Refill: 1  2. Acquired hypothyroidism Lab Results  Component Value Date   TSH 4.80 (H) 11/13/2022  - continue on levothyroxine on empty stomach  - levothyroxine (SYNTHROID) 50 MCG tablet; Take 1 tablet (50 mcg total) by mouth daily.  Dispense: 90 tablet; Refill: 1  3. Mixed hyperlipidemia LDL at goal  - No SE reported  - continue on atorvastatin  - atorvastatin (LIPITOR) 40 MG tablet; Take 1 tablet (40 mg total) by mouth daily.  Dispense: 90 tablet; Refill: 1  4. Gastroesophageal reflux disease without esophagitis Symptoms controlled. H/H stable.No tarry or black stool  - advised to avoid eating meals late in the evening and to avoid aggravating foods and spices. - wean off Nexium every other day then stop.may restart Nexium if symptoms recurs.    5. Pre-diabetes Lab Results  Component Value Date   HGBA1C 5.8 (A) 03/26/2021  - continue with dietary modification and exercise - congratulated on getting her weight down.   Family/ staff Communication: Reviewed plan of care with patient verbalized understanding   Labs/tests ordered:  - CBC with Differential/Platelet - CMP with eGFR([facility]) - TSH - Hgb A1C - Lipid panel   Next Appointment : Return in about 6 months (around 05/09/2024) for medical mangement of chronic issues., Fasting labs in 6 months prior to visit.   Caesar Bookman, NP

## 2023-11-10 NOTE — Patient Instructions (Signed)
-   Advised to check Blood pressure at home and record on log  notify provider if B/p > 140/90

## 2023-11-11 LAB — CBC WITH DIFFERENTIAL/PLATELET
Absolute Lymphocytes: 1800 {cells}/uL (ref 850–3900)
Absolute Monocytes: 420 {cells}/uL (ref 200–950)
Basophils Absolute: 72 {cells}/uL (ref 0–200)
Basophils Relative: 1.2 %
Eosinophils Absolute: 378 {cells}/uL (ref 15–500)
Eosinophils Relative: 6.3 %
HCT: 42.3 % (ref 35.0–45.0)
Hemoglobin: 14 g/dL (ref 11.7–15.5)
MCH: 30.4 pg (ref 27.0–33.0)
MCHC: 33.1 g/dL (ref 32.0–36.0)
MCV: 92 fL (ref 80.0–100.0)
MPV: 11.4 fL (ref 7.5–12.5)
Monocytes Relative: 7 %
Neutro Abs: 3330 {cells}/uL (ref 1500–7800)
Neutrophils Relative %: 55.5 %
Platelets: 293 10*3/uL (ref 140–400)
RBC: 4.6 10*6/uL (ref 3.80–5.10)
RDW: 12.1 % (ref 11.0–15.0)
Total Lymphocyte: 30 %
WBC: 6 10*3/uL (ref 3.8–10.8)

## 2023-11-11 LAB — LIPID PANEL
Cholesterol: 123 mg/dL (ref <200)
HDL: 48 mg/dL — ABNORMAL LOW (ref >=50)
LDL Cholesterol (Calc): 59 mg/dL
Non-HDL Cholesterol (Calc): 75 mg/dL (ref <130)
Total CHOL/HDL Ratio: 2.6 (calc) (ref <5.0)
Triglycerides: 76 mg/dL (ref <150)

## 2023-11-11 LAB — COMPLETE METABOLIC PANEL WITH GFR
AG Ratio: 1.7 (calc) (ref 1.0–2.5)
ALT: 24 U/L (ref 6–29)
AST: 20 U/L (ref 10–35)
Albumin: 4.7 g/dL (ref 3.6–5.1)
Alkaline phosphatase (APISO): 133 U/L (ref 37–153)
BUN: 18 mg/dL (ref 7–25)
CO2: 31 mmol/L (ref 20–32)
Calcium: 10.7 mg/dL — ABNORMAL HIGH (ref 8.6–10.4)
Chloride: 100 mmol/L (ref 98–110)
Creat: 0.86 mg/dL (ref 0.50–1.05)
Globulin: 2.7 g/dL (ref 1.9–3.7)
Glucose, Bld: 102 mg/dL — ABNORMAL HIGH (ref 65–99)
Potassium: 4.3 mmol/L (ref 3.5–5.3)
Sodium: 138 mmol/L (ref 135–146)
Total Bilirubin: 0.5 mg/dL (ref 0.2–1.2)
Total Protein: 7.4 g/dL (ref 6.1–8.1)
eGFR: 73 mL/min/{1.73_m2} (ref >=60)

## 2023-11-11 LAB — HEMOGLOBIN A1C
Hgb A1c MFr Bld: 6.3 %{Hb} — ABNORMAL HIGH (ref <5.7)
Mean Plasma Glucose: 134 mg/dL
eAG (mmol/L): 7.4 mmol/L

## 2023-11-11 LAB — TSH: TSH: 2.07 m[IU]/L (ref 0.40–4.50)

## 2024-01-06 ENCOUNTER — Telehealth: Payer: Self-pay | Admitting: Sports Medicine

## 2024-01-06 NOTE — Telephone Encounter (Signed)
 Patient called. Would like to know if it is time for another injection? Her cb # [phone]

## 2024-01-06 NOTE — Telephone Encounter (Signed)
 Called patient and left a voicemail to give the office a call to schedule an appointment.  (Patient's last injection was 09/01/2023. Since it has been more than 3 months, she can schedule for repeat injection.)

## 2024-01-16 ENCOUNTER — Encounter: Payer: Self-pay | Admitting: Sports Medicine

## 2024-01-16 ENCOUNTER — Ambulatory Visit: Payer: PPO | Admitting: Sports Medicine

## 2024-01-16 DIAGNOSIS — M25562 Pain in left knee: Secondary | ICD-10-CM

## 2024-01-16 DIAGNOSIS — M1712 Unilateral primary osteoarthritis, left knee: Secondary | ICD-10-CM

## 2024-01-16 DIAGNOSIS — G8929 Other chronic pain: Secondary | ICD-10-CM | POA: Diagnosis not present

## 2024-01-16 DIAGNOSIS — E66811 Obesity, class 1: Secondary | ICD-10-CM | POA: Diagnosis not present

## 2024-01-16 MED ORDER — METHYLPREDNISOLONE ACETATE 40 MG/ML IJ SUSP
40.0000 mg | INTRAMUSCULAR | Status: AC | PRN
  Administered 2024-01-16: 40 mg via INTRA_ARTICULAR

## 2024-01-16 MED ORDER — BUPIVACAINE HCL 0.25 % IJ SOLN
2.0000 mL | INTRAMUSCULAR | Status: AC | PRN
  Administered 2024-01-16: 2 mL via INTRA_ARTICULAR

## 2024-01-16 MED ORDER — LIDOCAINE HCL 1 % IJ SOLN
2.0000 mL | INTRAMUSCULAR | Status: AC | PRN
  Administered 2024-01-16: 2 mL

## 2024-01-16 NOTE — Progress Notes (Signed)
Patient says that she got good relief from the last injection, and that her knee feels okay but she does have some flare ups. She says that in the gym she does have pain when using the bike and other equipment, although she is able to push through it. She is going on a cruise in a couple of weeks and would like the injection prior to that as she will have a lot of walking that week.

## 2024-01-16 NOTE — Progress Notes (Signed)
Sara Ward - 68 y.o. female MRN [id number]  Date of birth: 04-18-1956  Office Visit Note: Visit Date: 01/16/2024 PCP: Caesar Bookman, NP Referred by: Caesar Bookman, NP  Subjective: Chief Complaint  Patient presents with   Left Knee - Pain   HPI: Sara Ward is a pleasant 68 y.o. female who presents today for acute on chronic left knee pain with OA. Also has been working on weight loss.  Left knee -she has known progressive arthritis worse in the medial tibiofemoral compartment of the knee.  She has received good relief from previous injections, therapy and her workouts.  Last had an injection on 09/01/2023 which has been helpful for her.  She denies any recent swelling.  She does use meloxicam infrequently, as taking 15 mg once daily for the last week with good improvement.  No she does not take this consistently.  She also has been working on healthy weight loss to bring down her weight and control her prediabetes.  She has lost about 15 pounds since she has been getting her exercise regimen, she does go to [employer] at least 3-5 times weekly.  She is going on a cruise in a few weeks and would like to be feeling well for this.  Pertinent ROS were reviewed with the patient and found to be negative unless otherwise specified above in HPI.   Assessment & Plan: Visit Diagnoses:  1. Unilateral primary osteoarthritis, left knee   2. Chronic pain of left knee   3. Obesity (BMI 30.0-34.9)    Plan: Impression is acute exacerbation of chronic left knee pain with progressive arthritis.  Gavin Pound has moderate to severe joint space loss in the medial tibiofemoral compartment does have some restriction in her range of motion.  She has been working hard on physical exercise, stationary bicycle and workouts to help offload the knee as well as create a lifestyle and weight loss.  I did commend her on her 15 pound weight loss today.  For the left knee today, we did proceed with corticosteroid  injection into the left knee, patient tolerated well.  Advised on 48 hours of modified rest/activity and then may return without restriction. She has received benefit from meloxicam 15 mg daily, we discussed using this on an as-needed basis for a few days at a time when needed for her pain, she is agreeable.  Her last knee x-rays were just over 1 year ago, when she follows up sometime in the next 3 to 6 months, we will plan to repeat weightbearing x-rays of the left knee.  Follow-up: Return in about 3 months (around 04/15/2024), or if symptoms worsen or fail to improve, for R-knee (will update knee XR's at visit).   Meds & Orders: No orders of the defined types were placed in this encounter.  No orders of the defined types were placed in this encounter.    Procedures: Large Joint Inj: L knee on 01/16/2024 10:22 AM Indications: pain Details: 22 G 1.5 in needle, anterolateral approach Medications: 2 mL lidocaine 1 %; 2 mL bupivacaine 0.25 %; 40 mg methylPREDNISolone acetate 40 MG/ML Outcome: tolerated well, no immediate complications  Knee Injection, Left: After discussion on risks/benefits/indications, informed verbal consent was obtained and a timeout was performed, patient was seated on exam table. The patient's knee was prepped with Betadine and alcohol swab and utilizing anterolateral approach, the patient's knee was injected intraarticularly with 2:2:1 lidocaine 1%:bupivicaine 0.25%:depomedrol. Patient tolerated the procedure well without immediate complications.  Procedure, treatment alternatives, risks and benefits explained, specific risks discussed. Consent was given by the patient. Patient was prepped and draped in the usual sterile fashion.          Clinical History: No specialty comments available.  She reports that she has quit smoking. She has never used smokeless tobacco.  Recent Labs    11/10/23 0958  HGBA1C 6.3*    Objective:    Physical Exam  Gen: Well-appearing, in  no acute distress; non-toxic CV: Well-perfused. Warm.  Resp: Breathing unlabored on room air; no wheezing. Psych: Fluid speech in conversation; appropriate affect; normal thought process  Ortho Exam - Left knee:   Imaging: No results found.  Past Medical/Family/Surgical/Social History: Medications & Allergies reviewed per EMR, new medications updated. Patient Active Problem List   Diagnosis Date Noted   Healthcare maintenance 05/01/2021   Insomnia 03/26/2021   GERD (gastroesophageal reflux disease) 10/11/2019   Joint pain 05/31/2019   Hyperlipidemia 05/31/2019   Chronic pain of left knee 01/23/2018   RUQ pain 03/14/2017   Atrophic vaginitis 05/30/2014   Pre-diabetes 05/30/2014   Vitamin D deficiency 05/30/2014   Obesity (BMI 30.0-34.9) 05/26/2014   Hypothyroidism 12/04/2011   Essential hypertension 11/04/2011   Past Medical History:  Diagnosis Date   Complication of anesthesia    hard to wake up   Hypertension    Hypothyroidism    Family History  Problem Relation Age of Onset   Hypertension Mother    Hypertension Father    Emphysema Father    Heart attack Brother    Breast cancer Neg Hx    Past Surgical History:  Procedure Laterality Date   APPENDECTOMY  2014   LAPAROSCOPIC APPENDECTOMY N/A 11/30/2013   Procedure: APPENDECTOMY LAPAROSCOPIC;  Surgeon: Adolph Pollack, MD;  Location: [facility];  Service: General;  Laterality: N/A;   TUBAL LIGATION     Social History   Occupational History   Not on file  Tobacco Use   Smoking status: Former   Smokeless tobacco: Never   Tobacco comments:    quit 40+ years ago  Vaping Use   Vaping status: Never Used  Substance and Sexual Activity   Alcohol use: Yes    Comment: occasional wine   Drug use: Never   Sexual activity: Yes    Partners: Male    Birth control/protection: Post-menopausal    Comment: husband

## 2024-02-05 ENCOUNTER — Other Ambulatory Visit: Payer: Self-pay | Admitting: Family

## 2024-02-05 DIAGNOSIS — E782 Mixed hyperlipidemia: Secondary | ICD-10-CM

## 2024-02-09 ENCOUNTER — Other Ambulatory Visit: Payer: Self-pay | Admitting: Family

## 2024-02-09 DIAGNOSIS — E039 Hypothyroidism, unspecified: Secondary | ICD-10-CM

## 2024-02-09 NOTE — Telephone Encounter (Signed)
Per Pharmacy comment: Patient has been receiving the [employer] brand generic levothyroxine which is now on backorder due to a recall.  We will need to change patient to the Amneal brand generic.  Thank you.

## 2024-02-17 ENCOUNTER — Other Ambulatory Visit: Payer: Self-pay | Admitting: Family

## 2024-02-17 DIAGNOSIS — Z1231 Encounter for screening mammogram for malignant neoplasm of breast: Secondary | ICD-10-CM

## 2024-03-23 ENCOUNTER — Ambulatory Visit
Admission: RE | Admit: 2024-03-23 | Discharge: 2024-03-23 | Disposition: A | Discharge status: Home / Self Care | Payer: PPO | Source: Ambulatory Visit | Attending: Family | Admitting: Family

## 2024-03-23 DIAGNOSIS — Z1231 Encounter for screening mammogram for malignant neoplasm of breast: Secondary | ICD-10-CM

## 2024-05-06 ENCOUNTER — Other Ambulatory Visit: Payer: PPO

## 2024-05-06 DIAGNOSIS — R7303 Prediabetes: Secondary | ICD-10-CM | POA: Diagnosis not present

## 2024-05-06 DIAGNOSIS — E782 Mixed hyperlipidemia: Secondary | ICD-10-CM

## 2024-05-06 DIAGNOSIS — I10 Essential (primary) hypertension: Secondary | ICD-10-CM

## 2024-05-06 DIAGNOSIS — E039 Hypothyroidism, unspecified: Secondary | ICD-10-CM | POA: Diagnosis not present

## 2024-05-07 LAB — COMPLETE METABOLIC PANEL WITHOUT GFR
AG Ratio: 1.5 (calc) (ref 1.0–2.5)
ALT: 25 U/L (ref 6–29)
AST: 20 U/L (ref 10–35)
Albumin: 4.3 g/dL (ref 3.6–5.1)
Alkaline phosphatase (APISO): 131 U/L (ref 37–153)
BUN: 24 mg/dL (ref 7–25)
CO2: 31 mmol/L (ref 20–32)
Calcium: 10.4 mg/dL (ref 8.6–10.4)
Chloride: 100 mmol/L (ref 98–110)
Creat: 0.94 mg/dL (ref 0.50–1.05)
Globulin: 2.9 g/dL (ref 1.9–3.7)
Glucose, Bld: 106 mg/dL — ABNORMAL HIGH (ref 65–99)
Potassium: 4 mmol/L (ref 3.5–5.3)
Sodium: 139 mmol/L (ref 135–146)
Total Bilirubin: 0.5 mg/dL (ref 0.2–1.2)
Total Protein: 7.2 g/dL (ref 6.1–8.1)

## 2024-05-07 LAB — CBC WITH DIFFERENTIAL/PLATELET
Absolute Lymphocytes: 2120 {cells}/uL (ref 850–3900)
Absolute Monocytes: 517 {cells}/uL (ref 200–950)
Basophils Absolute: 61 {cells}/uL (ref 0–200)
Basophils Relative: 0.8 %
Eosinophils Absolute: 312 {cells}/uL (ref 15–500)
Eosinophils Relative: 4.1 %
HCT: 44.1 % (ref 35.0–45.0)
Hemoglobin: 14 g/dL (ref 11.7–15.5)
MCH: 29.5 pg (ref 27.0–33.0)
MCHC: 31.7 g/dL — ABNORMAL LOW (ref 32.0–36.0)
MCV: 92.8 fL (ref 80.0–100.0)
MPV: 11 fL (ref 7.5–12.5)
Monocytes Relative: 6.8 %
Neutro Abs: 4590 {cells}/uL (ref 1500–7800)
Neutrophils Relative %: 60.4 %
Platelets: 296 10*3/uL (ref 140–400)
RBC: 4.75 10*6/uL (ref 3.80–5.10)
RDW: 11.8 % (ref 11.0–15.0)
Total Lymphocyte: 27.9 %
WBC: 7.6 10*3/uL (ref 3.8–10.8)

## 2024-05-07 LAB — LIPID PANEL
Cholesterol: 125 mg/dL (ref <200)
HDL: 55 mg/dL (ref >=50)
LDL Cholesterol (Calc): 53 mg/dL
Non-HDL Cholesterol (Calc): 70 mg/dL (ref <130)
Total CHOL/HDL Ratio: 2.3 (calc) (ref <5.0)
Triglycerides: 90 mg/dL (ref <150)

## 2024-05-07 LAB — TSH: TSH: 2.6 m[IU]/L (ref 0.40–4.50)

## 2024-05-07 LAB — HEMOGLOBIN A1C
Hgb A1c MFr Bld: 6.4 % — ABNORMAL HIGH (ref <5.7)
Mean Plasma Glucose: 137 mg/dL
eAG (mmol/L): 7.6 mmol/L

## 2024-05-10 ENCOUNTER — Ambulatory Visit: Payer: PPO | Admitting: Family

## 2024-05-10 ENCOUNTER — Encounter: Payer: Self-pay | Admitting: Family

## 2024-05-10 VITALS — BP 128/76 | HR 71 | Temp 97.6°F | Resp 20 | Ht 60.0 in | Wt 173.6 lb

## 2024-05-10 DIAGNOSIS — R7303 Prediabetes: Secondary | ICD-10-CM | POA: Diagnosis not present

## 2024-05-10 DIAGNOSIS — G8929 Other chronic pain: Secondary | ICD-10-CM

## 2024-05-10 DIAGNOSIS — E039 Hypothyroidism, unspecified: Secondary | ICD-10-CM | POA: Diagnosis not present

## 2024-05-10 DIAGNOSIS — E782 Mixed hyperlipidemia: Secondary | ICD-10-CM

## 2024-05-10 DIAGNOSIS — I10 Essential (primary) hypertension: Secondary | ICD-10-CM

## 2024-05-10 DIAGNOSIS — K219 Gastro-esophageal reflux disease without esophagitis: Secondary | ICD-10-CM

## 2024-05-10 DIAGNOSIS — M25561 Pain in right knee: Secondary | ICD-10-CM

## 2024-05-10 NOTE — Progress Notes (Signed)
 Provider: Christean Courts FNP-C   Geeta Dworkin, Elijio Guadeloupe, NP  Patient Care Team: Izear Pine, Elijio Guadeloupe, NP as PCP - General (Family Medicine) Juris Olmstead, NP (Inactive) as Nurse Practitioner (Gynecology)  Extended Emergency Contact Information Primary Emergency Contact: Laurell, Coalson Address: [address]          [place], [place] Home Phone: [phone] Relation: Spouse  Code Status:  Full Code  Goals of care: Advanced Directive information    05/10/2024    9:24 AM  Advanced Directives  Does Patient Have a Medical Advance Directive? No  Would patient like information on creating a medical advance directive? No - Patient declined     Chief Complaint  Patient presents with   Medical Management of Chronic Issues    6 Month follow up.    Discussed the use of AI scribe software for clinical note transcription with the patient, who gave verbal consent to proceed.  History of Present Illness   Sara Ward is a 68 year old female with hypertension and hyperlipidemia who presents for a six-month follow-up visit.  She attends the gym three to four times a week, spending about an hour each session, which includes 20-22 minutes on the treadmill and muscle exercises. She notes a slight weight decrease from 174 to 173 pounds since her last visit.  Her glucose level has increased from 102 mg/dL six months ago to 098 mg/dL, and her J1B has risen from 6.3% to 6.4%. She has a preference for starchy foods and sweets.  She is currently taking aspirin , a multivitamin, lactobacillus, and Nexium  for acid reflux, which she finds effective. She has discontinued meloxicam , which was used for knee flare-ups, as she rarely experiences these now. She receives knee injections approximately every four months for pain management.  She is on amlodipine  5 mg, olmesartan , and hydrochlorothiazide  for blood pressure management, and reports no symptoms such as headaches, dizziness,  chest pain, or palpitations.  She takes atorvastatin  40 mg for cholesterol management and reports no side effects. Her cholesterol levels are within normal range, with a total cholesterol of 125 mg/dL, triglycerides at 90 mg/dL, and LDL at 53 mg/dL.  She is on levothyroxine  50 mcg for thyroid  management.  She has a history of a torn meniscus, which was repaired, and she continues to work on strengthening her knee through exercise. She experiences some pain on the treadmill but is trying to build muscle strength.  No constipation, numbness, tingling, or issues with urination. No changes in vision, having had a recent eye exam. No pain in her forehead or sinuses, and no tenderness in her back or abdomen.   Past Medical History:  Diagnosis Date   Complication of anesthesia    hard to wake up   Hypertension    Hypothyroidism    Past Surgical History:  Procedure Laterality Date   APPENDECTOMY  2014   LAPAROSCOPIC APPENDECTOMY N/A 11/30/2013   Procedure: APPENDECTOMY LAPAROSCOPIC;  Surgeon: Harlee Lichtenstein, MD;  Location: [facility];  Service: General;  Laterality: N/A;   TUBAL LIGATION      Allergies  Allergen Reactions   Contrast Media [Iodinated Contrast Media]     Itching and hives    Allergies as of 05/10/2024       Reactions   Contrast Media [iodinated Contrast Media]    Itching and hives        Medication List        Accurate as of May 10, 2024  9:56 AM. If you have any questions, ask your nurse or doctor.          STOP taking these medications    meloxicam  15 MG tablet Commonly known as: MOBIC  Stopped by: Leonda Cristo C Clark Clowdus       TAKE these medications    amLODipine  5 MG tablet Commonly known as: NORVASC  TAKE 1 TABLET BY MOUTH EVERY DAY   aspirin  81 MG tablet Take 1 tablet (81 mg total) by mouth daily.   atorvastatin  40 MG tablet Commonly known as: LIPITOR TAKE 1 TABLET BY MOUTH EVERY DAY   CULTURELLE PO Take 1 capsule by mouth in the morning.    esomeprazole  20 MG capsule Commonly known as: NEXIUM  TAKE 1 CAPSULE BY MOUTH EVERY DAY BEFORE breakfast   levothyroxine  50 MCG tablet Commonly known as: SYNTHROID  TAKE 1 TABLET BY MOUTH EVERY DAY   MULTIVITAMIN PO Take 1 capsule by mouth as needed.   olmesartan -hydrochlorothiazide  40-25 MG tablet Commonly known as: BENICAR  HCT Take 1 tablet by mouth daily.   VITAMIN D  PO Take 1 capsule by mouth as needed.        Review of Systems  Constitutional:  Negative for appetite change, chills, fatigue, fever and unexpected weight change.  HENT:  Negative for congestion, dental problem, ear discharge, ear pain, facial swelling, hearing loss, nosebleeds, postnasal drip, rhinorrhea, sinus pressure, sinus pain, sneezing, sore throat, tinnitus and trouble swallowing.   Eyes:  Negative for pain, discharge, redness, itching and visual disturbance.  Respiratory:  Negative for cough, chest tightness, shortness of breath and wheezing.   Cardiovascular:  Negative for chest pain, palpitations and leg swelling.  Gastrointestinal:  Negative for abdominal distention, abdominal pain, blood in stool, constipation, diarrhea, nausea and vomiting.  Endocrine: Negative for cold intolerance, heat intolerance, polydipsia, polyphagia and polyuria.  Genitourinary:  Negative for difficulty urinating, dysuria, flank pain, frequency and urgency.  Musculoskeletal:  Positive for arthralgias. Negative for back pain, gait problem, joint swelling, myalgias, neck pain and neck stiffness.       Right knee pain   Skin:  Negative for color change, pallor, rash and wound.  Neurological:  Negative for dizziness, syncope, speech difficulty, weakness, light-headedness, numbness and headaches.  Hematological:  Does not bruise/bleed easily.  Psychiatric/Behavioral:  Negative for agitation, behavioral problems, confusion, hallucinations, self-injury, sleep disturbance and suicidal ideas. The patient is not nervous/anxious.      Immunization History  Administered Date(s) Administered   Fluad Quad(high Dose 65+) 09/30/2023   Influenza,inj,Quad PF,6+ Mos 01/21/2018, 10/17/2018, 10/11/2019   Influenza-Unspecified 10/17/2018, 09/17/2022   PFIZER Comirnaty (Gray Top)Covid-19 Tri-Sucrose Vaccine 05/01/2021, 09/30/2023   Pfizer Covid-19 Vaccine Bivalent Booster 45yrs & up 11/01/2021   Tdap 05/01/2021   Zoster Recombinant(Shingrix) 10/12/2019, 01/01/2022   Pertinent  Health Maintenance Due  Topic Date Due   INFLUENZA VACCINE  07/23/2024   MAMMOGRAM  03/23/2026   Colonoscopy  11/25/2032   DEXA SCAN  Completed      01/21/2018   11:58 AM 05/31/2019    4:24 PM 10/11/2019    8:32 AM 11/07/2022    9:57 AM 05/10/2024    9:23 AM  Fall Risk  Falls in the past year? No 0 0 0 1  Was there an injury with Fall?    0 0  Fall Risk Category Calculator    0 1  Fall Risk Category (Retired)    Low   (RETIRED) Patient Fall Risk Level  Low fall risk Low fall risk Low fall risk  Patient at Risk for Falls Due to    No Fall Risks No Fall Risks  Fall risk Follow up    Falls evaluation completed Falls evaluation completed   Functional Status Survey:    Vitals:   05/10/24 0926  BP: 128/76  Pulse: 71  Resp: 20  Temp: 97.6 F (36.4 C)  SpO2: 92%  Weight: 173 lb 9.6 oz (78.7 kg)  Height: 5' (1.524 m)   Body mass index is 33.9 kg/m. Physical Exam  VITALS: T- 97.6, P- 71, BP- 128/76, SaO2- 92% MEASUREMENTS: Weight- 173. GENERAL: Alert, cooperative, well developed, no acute distress HEENT: Normocephalic, normal oropharynx, moist mucous membranes, external auditory canals clear, nose normal, no sinus tenderness NECK: Thyroid  normal CHEST: Clear to auscultation bilaterally, no wheezes, rhonchi, or crackles CARDIOVASCULAR: Normal heart rate and rhythm, S1 and S2 normal without murmurs ABDOMEN: Soft, non-tender, non-distended, without organomegaly, normal bowel sounds, liver normal EXTREMITIES: No cyanosis or  edema MUSCULOSKELETAL: Right knee with popping NEUROLOGICAL: Cranial nerves grossly intact, moves all extremities without gross motor or sensory deficit, normal gait  SKIN: No rash,no lesion or erythema   PSYCHIATRY/BEHAVIORAL: Mood stable    Labs reviewed: Recent Labs    11/10/23 0958 05/06/24 0809  NA 138 139  K 4.3 4.0  CL 100 100  CO2 31 31  GLUCOSE 102* 106*  BUN 18 24  CREATININE 0.86 0.94  CALCIUM  10.7* 10.4   Recent Labs    11/10/23 0958 05/06/24 0809  AST 20 20  ALT 24 25  BILITOT 0.5 0.5  PROT 7.4 7.2   Recent Labs    11/10/23 0958 05/06/24 0809  WBC 6.0 7.6  NEUTROABS 3,330 4,590  HGB 14.0 14.0  HCT 42.3 44.1  MCV 92.0 92.8  PLT 293 296   Lab Results  Component Value Date   TSH 2.60 05/06/2024   Lab Results  Component Value Date   HGBA1C 6.4 (H) 05/06/2024   Lab Results  Component Value Date   CHOL 125 05/06/2024   HDL 55 05/06/2024   LDLCALC 53 05/06/2024   TRIG 90 05/06/2024   CHOLHDL 2.3 05/06/2024    Significant Diagnostic Results in last 30 days:  No results found.  Assessment/Plan  Prediabetes  A1c increased from 6.3 to 6.4 over six months, indicating a trend towards diabetes. Glucose level rose from 102 to 106. Discussed dietary modifications, particularly reducing starchy foods and sweets, and emphasized exercise to manage blood sugar. Highlighted diabetes risks, including complications affecting eyes, kidneys, heart, and circulation. Treatment for diabetes will be initiated if A1c reaches 6.5. - Monitor A1c and glucose levels every six months. - Encourage dietary modifications to reduce intake of starchy foods and sweets. - Continue regular exercise regimen.  Hypertension Blood pressure is well-controlled at 128/76 mmHg with amlodipine , olmesartan , and hydrochlorothiazide . No symptoms of headache, dizziness, chest pain, or palpitations reported.  Hyperlipidemia Cholesterol levels are well-controlled with atorvastatin . Total  cholesterol is 125 mg/dL, triglycerides are 90 mg/dL, and LDL is 53 mg/dL. No side effects from atorvastatin  reported.  Hypothyroidism Thyroid  function is well-managed with levothyroxine  50 mcg. No adjustments needed.  Acid Reflux Experiences acid reflux symptoms when not taking Nexium . Symptoms improve with medication. - Continue Nexium  as needed for acid reflux.  Knee Pain Intermittent knee pain managed with periodic injections every four months. Meloxicam  prescribed for flare-ups but rarely used. Torn meniscus surgically repaired; ongoing strengthening exercises encouraged. - Continue periodic knee injections as needed. - Use meloxicam  for flare-ups as needed. - Continue  strengthening exercises for knee.  General Health Maintenance Mammogram and colonoscopy are up to date. Bone density scan performed last year. Due for pneumonia and COVID vaccines. Medicare wellness visit is also due. - Administer pneumonia and COVID vaccines. - Schedule Medicare wellness visit via video.  Follow-up Regular monitoring of lab work is important to prevent progression of conditions. Labs to be checked every six months to ensure levels remain stable. - Schedule follow-up appointment in six months with lab work.   Family/ staff Communication: Reviewed plan of care with patient verbalized understanding   Labs/tests ordered:  - CBC with Differential/Platelet - CMP with eGFR([facility]) - TSH - Hgb A1C - Lipid panel   Next Appointment : Return in about 6 months (around 11/10/2024) for medical mangement of chronic issues., Fasting labs in 6 months prior to visit.Annual wellness visit.   Spent 30 minutes of Face to face and non-face to face with patient  >50% time spent counseling; reviewing medical record; tests; labs; documentation and developing future plan of care.   Estil Heman, NP

## 2024-05-19 ENCOUNTER — Other Ambulatory Visit: Payer: Self-pay | Admitting: Family

## 2024-05-19 DIAGNOSIS — I10 Essential (primary) hypertension: Secondary | ICD-10-CM

## 2024-10-25 ENCOUNTER — Encounter: Payer: Self-pay | Admitting: Radiology

## 2024-11-03 ENCOUNTER — Ambulatory Visit: Payer: Self-pay | Admitting: Family

## 2024-11-06 DIAGNOSIS — J40 Bronchitis, not specified as acute or chronic: Secondary | ICD-10-CM | POA: Diagnosis not present

## 2024-11-08 ENCOUNTER — Other Ambulatory Visit: Payer: Self-pay

## 2024-11-10 ENCOUNTER — Ambulatory Visit: Payer: Self-pay | Admitting: Family

## 2024-11-10 ENCOUNTER — Other Ambulatory Visit: Payer: Self-pay | Admitting: Family

## 2024-11-10 DIAGNOSIS — E782 Mixed hyperlipidemia: Secondary | ICD-10-CM

## 2024-11-10 DIAGNOSIS — E039 Hypothyroidism, unspecified: Secondary | ICD-10-CM

## 2024-11-24 ENCOUNTER — Other Ambulatory Visit: Payer: Self-pay | Admitting: Family

## 2024-11-24 DIAGNOSIS — J019 Acute sinusitis, unspecified: Secondary | ICD-10-CM | POA: Diagnosis not present

## 2024-11-24 DIAGNOSIS — R7303 Prediabetes: Secondary | ICD-10-CM | POA: Diagnosis not present

## 2024-11-24 DIAGNOSIS — E559 Vitamin D deficiency, unspecified: Secondary | ICD-10-CM | POA: Diagnosis not present

## 2024-11-24 DIAGNOSIS — F419 Anxiety disorder, unspecified: Secondary | ICD-10-CM | POA: Diagnosis not present

## 2024-11-24 DIAGNOSIS — K219 Gastro-esophageal reflux disease without esophagitis: Secondary | ICD-10-CM | POA: Diagnosis not present

## 2024-11-24 DIAGNOSIS — I10 Essential (primary) hypertension: Secondary | ICD-10-CM | POA: Diagnosis not present

## 2024-11-24 DIAGNOSIS — E079 Disorder of thyroid, unspecified: Secondary | ICD-10-CM | POA: Diagnosis not present

## 2024-11-26 ENCOUNTER — Other Ambulatory Visit: Payer: Self-pay | Admitting: Family

## 2024-11-26 DIAGNOSIS — I10 Essential (primary) hypertension: Secondary | ICD-10-CM

## 2024-12-08 ENCOUNTER — Other Ambulatory Visit: Payer: Self-pay | Admitting: Family

## 2024-12-08 DIAGNOSIS — E782 Mixed hyperlipidemia: Secondary | ICD-10-CM

## 2024-12-08 NOTE — Telephone Encounter (Signed)
 Pharmacy requested refill.  Pended Rx and sent to [facility] for approval/denial  due to patient is overdue for an appointment.   Message sent to patient to call the office to schedule an appointment.

## 2024-12-09 NOTE — Telephone Encounter (Signed)
 It seems patient has established care with [facility], pls call patient if she is still coming to [facility].

## 2024-12-09 NOTE — Telephone Encounter (Signed)
 Tried calling patient. Could not leave message.  MyChart Message was sent to patient.
